# Patient Record
Sex: Male | Born: 1985 | Hispanic: No | Marital: Single | Smoking: Former smoker
Health system: Southern US, Community
[De-identification: ages and names within clinical notes are randomized; demographics above are authoritative.]

## PROBLEM LIST (undated history)

## (undated) DIAGNOSIS — K589 Irritable bowel syndrome without diarrhea: Secondary | ICD-10-CM

## (undated) HISTORY — DX: Irritable bowel syndrome, unspecified: K58.9

---

## 2012-12-19 ENCOUNTER — Encounter (HOSPITAL_COMMUNITY): Payer: Self-pay | Admitting: *Deleted

## 2012-12-19 ENCOUNTER — Emergency Department (HOSPITAL_COMMUNITY): Payer: PRIVATE HEALTH INSURANCE

## 2012-12-19 ENCOUNTER — Emergency Department (HOSPITAL_COMMUNITY)
Admission: EM | Admit: 2012-12-19 | Discharge: 2012-12-19 | Disposition: A | Discharge status: Home / Self Care | Payer: PRIVATE HEALTH INSURANCE | Attending: Emergency Medicine | Admitting: Emergency Medicine

## 2012-12-19 DIAGNOSIS — F17200 Nicotine dependence, unspecified, uncomplicated: Secondary | ICD-10-CM | POA: Insufficient documentation

## 2012-12-19 DIAGNOSIS — Y9339 Activity, other involving climbing, rappelling and jumping off: Secondary | ICD-10-CM | POA: Insufficient documentation

## 2012-12-19 DIAGNOSIS — S8990XA Unspecified injury of unspecified lower leg, initial encounter: Secondary | ICD-10-CM | POA: Insufficient documentation

## 2012-12-19 DIAGNOSIS — X500XXA Overexertion from strenuous movement or load, initial encounter: Secondary | ICD-10-CM | POA: Insufficient documentation

## 2012-12-19 DIAGNOSIS — M25572 Pain in left ankle and joints of left foot: Secondary | ICD-10-CM

## 2012-12-19 DIAGNOSIS — S99929A Unspecified injury of unspecified foot, initial encounter: Secondary | ICD-10-CM | POA: Insufficient documentation

## 2012-12-19 DIAGNOSIS — Y9289 Other specified places as the place of occurrence of the external cause: Secondary | ICD-10-CM | POA: Insufficient documentation

## 2012-12-19 MED ORDER — IBUPROFEN 800 MG PO TABS: 800.0000 mg | ORAL_TABLET | Freq: Three times a day (TID) | ORAL | Status: DC

## 2012-12-19 NOTE — ED Notes (Signed)
Pt reports jumped over barrier and twisted left ankle

## 2012-12-19 NOTE — ED Provider Notes (Signed)
History     CSN: [id number]  Arrival date & time 12/19/12  4098   None     Chief Complaint  Patient presents with  . Ankle Pain    (Consider location/radiation/quality/duration/timing/severity/associated sxs/prior treatment) Patient is a 27 y.o. male presenting with ankle pain. The history is provided by the patient. No language interpreter was used.  Ankle Pain Location:  Ankle Time since incident:  2 days Ankle location:  L ankle Pain details:    Quality:  Sharp   Radiates to: along lateral dorsal aspect of L foot.   Severity:  Mild   Onset quality:  Sudden   Timing:  Intermittent   Progression:  Unchanged Chronicity:  New Dislocation: no   Foreign body present:  No foreign bodies Relieved by:  Rest and elevation Worsened by:  Activity and bearing weight (palpation) Ineffective treatments:  None tried Associated symptoms: swelling   Associated symptoms: no decreased ROM, no fever, no muscle weakness, no numbness and no tingling     History reviewed. No pertinent past medical history.  History reviewed. No pertinent past surgical history.  No family history on file.  History  Substance Use Topics  . Smoking status: Current Every Day Smoker    Types: Cigarettes  . Smokeless tobacco: Never Used  . Alcohol Use: Yes     Comment: sometimes     Review of Systems  Constitutional: Negative for fever.  Musculoskeletal: Positive for joint swelling and arthralgias.  Skin: Negative for pallor.  Neurological: Negative for weakness and numbness.  All other systems reviewed and are negative.    Allergies  Review of patient's allergies indicates no known allergies.  Home Medications   Current Outpatient Rx  Name  Route  Sig  Dispense  Refill  . ibuprofen (ADVIL,MOTRIN) 800 MG tablet   Oral   Take 1 tablet (800 mg total) by mouth 3 (three) times daily.   21 tablet   0     BP 138/92  Pulse 75  Temp(Src) 98.3 F (36.8 C) (Oral)  Resp 18  Ht 5' 8.9"  (1.75 m)  Wt 158 lb 11.7 oz (72 kg)  BMI 23.51 kg/m2  SpO2 99%  Physical Exam  Nursing note and vitals reviewed. Constitutional: He is oriented to person, place, and time. He appears well-developed and well-nourished. No distress.  HENT:  Head: Normocephalic and atraumatic.  Eyes: Conjunctivae and EOM are normal. No scleral icterus.  Neck: Normal range of motion.  Cardiovascular: Normal rate, regular rhythm and intact distal pulses.   DP and PT pulses 2+ bilaterally; capillary refill normal  Pulmonary/Chest: Effort normal. No respiratory distress.  Musculoskeletal:       Left ankle: He exhibits decreased range of motion and swelling (mild). He exhibits no ecchymosis, no deformity and normal pulse. Tenderness. Lateral malleolus tenderness found. Achilles tendon normal.       Left lower leg: Normal.       Left foot: Normal.  Neurological: He is alert and oriented to person, place, and time.  Skin: Skin is warm and dry. No rash noted. He is not diaphoretic. No erythema. No pallor.  Psychiatric: He has a normal mood and affect. His behavior is normal.    ED Course  Procedures (including critical care time)  Labs Reviewed - No data to display Dg Ankle Complete Left  12/19/2012   *RADIOLOGY REPORT*  Clinical Data: Ankle pain.  Twisting injury.  LEFT ANKLE COMPLETE - 3+ VIEW  Comparison: None.  Findings: Mild lateral  soft tissue swelling.  No fracture, subluxation or dislocation.  Joint spaces are maintained.  IMPRESSION: Acute bony abnormality.   Original Report Authenticated By: Charlett Nose, M.D.    1. Ankle pain, left     MDM  Uncomplicated left ankle pain - patient is neurovascularly intact without pallor, poikilothermia, or paresthesias. X-ray of left ankle without evidence of fracture, subluxation, dislocation, or bony abnormality. Patient to be given ASO ankle brace and crutches for symptomatic improvement. Patient appropriate for discharge with primary care followup. Ibuprofen  recommended for symptoms and RICE instruction provided. Patient states comfort and understanding with this discharge plan with no unaddressed concerns.        Antony Madura, PA-C 12/23/12 1935

## 2012-12-24 NOTE — ED Provider Notes (Signed)
Medical screening examination/treatment/procedure(s) were performed by non-physician practitioner and as supervising physician I was immediately available for consultation/collaboration.  Maesyn Frisinger M Nahomy Limburg, MD 12/24/12 0716 

## 2013-05-18 ENCOUNTER — Emergency Department (HOSPITAL_COMMUNITY)
Admission: EM | Admit: 2013-05-18 | Discharge: 2013-05-18 | Disposition: A | Discharge status: Home / Self Care | Payer: Managed Care, Other (non HMO) | Attending: Emergency Medicine | Admitting: Emergency Medicine

## 2013-05-18 ENCOUNTER — Encounter (HOSPITAL_COMMUNITY): Payer: Self-pay | Admitting: Emergency Medicine

## 2013-05-18 DIAGNOSIS — Y9389 Activity, other specified: Secondary | ICD-10-CM | POA: Insufficient documentation

## 2013-05-18 DIAGNOSIS — X58XXXA Exposure to other specified factors, initial encounter: Secondary | ICD-10-CM | POA: Insufficient documentation

## 2013-05-18 DIAGNOSIS — S39012A Strain of muscle, fascia and tendon of lower back, initial encounter: Secondary | ICD-10-CM

## 2013-05-18 DIAGNOSIS — S336XXA Sprain of sacroiliac joint, initial encounter: Secondary | ICD-10-CM | POA: Insufficient documentation

## 2013-05-18 DIAGNOSIS — Y929 Unspecified place or not applicable: Secondary | ICD-10-CM | POA: Insufficient documentation

## 2013-05-18 DIAGNOSIS — F17200 Nicotine dependence, unspecified, uncomplicated: Secondary | ICD-10-CM | POA: Insufficient documentation

## 2013-05-18 MED ORDER — IBUPROFEN 600 MG PO TABS: 600.0000 mg | ORAL_TABLET | Freq: Four times a day (QID) | ORAL | Status: DC | PRN

## 2013-05-18 MED ORDER — METHOCARBAMOL 500 MG PO TABS: 500.0000 mg | ORAL_TABLET | Freq: Two times a day (BID) | ORAL | Status: DC

## 2013-05-18 NOTE — ED Provider Notes (Signed)
CSN: [id number]     Arrival date & time 05/18/13  8295 History   First MD Initiated Contact with Patient 05/18/13 0612     Chief Complaint  Patient presents with  . Back Pain   (Consider location/radiation/quality/duration/timing/severity/associated sxs/prior Treatment) Patient is a 27 y.o. male presenting with back pain. The history is provided by the patient. No language interpreter was used.  Back Pain Location:  Lumbar spine Quality: pressure. Radiates to:  Does not radiate Pain severity:  Moderate Onset quality:  Gradual Duration:  6 days Timing:  Intermittent Progression:  Worsening (x2 days) Chronicity:  New Context comment:  Onset after sleeping on the couch Relieved by:  None tried Worsened by:  Palpation and movement Ineffective treatments:  None tried Associated symptoms: no bladder incontinence, no bowel incontinence, no chest pain, no dysuria, no fever, no numbness, no paresthesias, no perianal numbness, no tingling and no weakness   Risk factors: no hx of cancer     History reviewed. No pertinent past medical history. History reviewed. No pertinent past surgical history. History reviewed. No pertinent family history. History  Substance Use Topics  . Smoking status: Current Every Day Smoker    Types: Cigarettes  . Smokeless tobacco: Never Used  . Alcohol Use: Yes     Comment: sometimes    Review of Systems  Constitutional: Negative for fever.  Cardiovascular: Negative for chest pain.  Gastrointestinal: Negative for bowel incontinence.  Genitourinary: Negative for bladder incontinence and dysuria.  Musculoskeletal: Positive for back pain.  Neurological: Negative for tingling, weakness, numbness and paresthesias.  All other systems reviewed and are negative.    Allergies  Review of patient's allergies indicates no known allergies.  Home Medications   Current Outpatient Rx  Name  Route  Sig  Dispense  Refill  . ibuprofen (ADVIL,MOTRIN) 600 MG  tablet   Oral   Take 1 tablet (600 mg total) by mouth every 6 (six) hours as needed for pain.   30 tablet   0   . methocarbamol (ROBAXIN) 500 MG tablet   Oral   Take 1 tablet (500 mg total) by mouth 2 (two) times daily.   20 tablet   0    BP 128/74  Pulse 69  Temp(Src) 98.3 F (36.8 C) (Oral)  Resp 18  SpO2 100% Physical Exam  Nursing note and vitals reviewed. Constitutional: He is oriented to person, place, and time. He appears well-developed and well-nourished. No distress.  HENT:  Head: Normocephalic and atraumatic.  Eyes: Conjunctivae and EOM are normal. No scleral icterus.  Neck: Normal range of motion.  Cardiovascular: Normal rate, regular rhythm and intact distal pulses.   DP and PT pulses 2+ b/l  Pulmonary/Chest: Effort normal. No respiratory distress.  Musculoskeletal: Normal range of motion.       Lumbar back: He exhibits tenderness and spasm. He exhibits normal range of motion, no bony tenderness, no swelling, no deformity, no laceration and no pain.       Back:  Tenderness to palpation of the left lumbosacral paraspinal muscles with spasm. No midline tenderness to palpation of the thoracic or lumbosacral spine. No bony deformities or step-offs palpated.  Neurological: He is alert and oriented to person, place, and time.  No sensory or motor deficits appreciated. DTRs normal and symmetric. Patient ambulatory with normal gait.  Skin: Skin is warm and dry. No rash noted. He is not diaphoretic. No erythema. No pallor.  Psychiatric: He has a normal mood and affect. His behavior is  normal.    ED Course  Procedures (including critical care time) Labs Review Labs Reviewed - No data to display Imaging Review No results found.  EKG Interpretation   None       MDM   1. Low back strain, initial encounter    27 year old male with no significant past medical history presents for strain to his left lower back x6 days, worsening x2 days. Patient states onset was  after sleeping on a couch overnight. Patient is neurovascularly intact and ambulatory. No midline tenderness to the lumbosacral spine appreciated. No red flags or signs concerning for cauda equina. Do not believe further work up with imaging is indicated at this time. Patient appropriate for discharge with ibuprofen and Robaxin for symptoms as well as instructions to apply ice to the affected area at least 4 times a day. Return precautions discussed and patient agreeable to plan with no unaddressed concerns.    Antony Madura, [place] 05/18/13 [phone]

## 2013-05-18 NOTE — ED Notes (Signed)
Patient is alert and oriented x3.  He was given DC instructions and follow up visit instructions.  Patient gave verbal understanding.  He was DC ambulatory under his own power to home.  V/S stable.  He was not showing any signs of distress on DC 

## 2013-05-18 NOTE — ED Provider Notes (Signed)
Medical screening examination/treatment/procedure(s) were performed by non-physician practitioner and as supervising physician I was immediately available for consultation/collaboration.  Chantelle Verdi M Raelynne Ludwick, MD 05/18/13 0701 

## 2013-05-18 NOTE — ED Notes (Signed)
Pt presents to the ED with a complaint of back pain.  Pt has had back pain for 6 days.  Pt is unsure if it was from sleeping on his sofa or driving 10 hours in one day.  Pt states pain is in his lower back

## 2013-05-18 NOTE — ED Notes (Addendum)
Patient is alert and oriented x3.  He is complaining of lower back pain that started 6 days ago.  He states That the pain is in his lower left back with radiation to the center lower back.  He denies any problems with urination or bowel movements. He also denies any numbness and tingling in any extremity.

## 2013-05-23 ENCOUNTER — Emergency Department (HOSPITAL_COMMUNITY)
Admission: EM | Admit: 2013-05-23 | Discharge: 2013-05-23 | Disposition: A | Discharge status: Home / Self Care | Payer: Managed Care, Other (non HMO) | Attending: Emergency Medicine | Admitting: Emergency Medicine

## 2013-05-23 ENCOUNTER — Encounter (HOSPITAL_COMMUNITY): Payer: Self-pay | Admitting: Emergency Medicine

## 2013-05-23 ENCOUNTER — Encounter: Payer: Self-pay | Admitting: Gastroenterology

## 2013-05-23 DIAGNOSIS — R509 Fever, unspecified: Secondary | ICD-10-CM | POA: Insufficient documentation

## 2013-05-23 DIAGNOSIS — R112 Nausea with vomiting, unspecified: Secondary | ICD-10-CM | POA: Insufficient documentation

## 2013-05-23 DIAGNOSIS — F17200 Nicotine dependence, unspecified, uncomplicated: Secondary | ICD-10-CM | POA: Insufficient documentation

## 2013-05-23 LAB — POCT I-STAT, CHEM 8
BUN: 20 mg/dL (ref 6–23)
Calcium, Ion: 1.17 mmol/L (ref 1.12–1.23)
Chloride: 106 meq/L (ref 96–112)
Creatinine, Ser: 1 mg/dL (ref 0.50–1.35)
Glucose, Bld: 113 mg/dL — ABNORMAL HIGH (ref 70–99)
HCT: 44 % (ref 39.0–52.0)
Hemoglobin: 15 g/dL (ref 13.0–17.0)
Potassium: 4.6 meq/L (ref 3.5–5.1)
Sodium: 142 meq/L (ref 135–145)
TCO2: 25 mmol/L (ref 0–100)

## 2013-05-23 MED ORDER — ONDANSETRON 4 MG PO TBDP: 4.0000 mg | ORAL_TABLET | Freq: Three times a day (TID) | ORAL | Status: DC | PRN

## 2013-05-23 MED ORDER — ONDANSETRON 4 MG PO TBDP
4.0000 mg | ORAL_TABLET | Freq: Once | ORAL | Status: AC
  Administered 2013-05-23: 4 mg via ORAL
  Filled 2013-05-23: qty 1

## 2013-05-23 MED ORDER — IBUPROFEN 800 MG PO TABS
800.0000 mg | ORAL_TABLET | Freq: Once | ORAL | Status: AC
  Administered 2013-05-23: 800 mg via ORAL
  Filled 2013-05-23: qty 1

## 2013-05-23 NOTE — ED Provider Notes (Signed)
Medical screening examination/treatment/procedure(s) were performed by non-physician practitioner and as supervising physician I was immediately available for consultation/collaboration.   Kristen N Ward, DO 05/23/13 0436 

## 2013-05-23 NOTE — ED Notes (Signed)
Pt reports vomiting and chills x1 day. Denies diarrhea or abd pain

## 2013-05-23 NOTE — ED Provider Notes (Signed)
CSN: [id number]     Arrival date & time 05/23/13  0100 History   First MD Initiated Contact with Patient 05/23/13 0106     Chief Complaint  Patient presents with  . Emesis   (Consider location/radiation/quality/duration/timing/severity/associated sxs/prior Treatment) HPI  Pt language: English and Arabic  Patient without any past medical history presents to the ED bib friends for 24 hours of nausea, vomiting, chills and subjective fever. He says that he has taken two doses of a new pain medication but has not had any in a few days. Otherwise he denies eating or drinking anything that his roommates/friends have not also had. He denies having any pain to his abdomen, dysuria, constipation, headache or neck pain. In triage he does not have any fever and has not taken any medication in the past 24 hours. He is able to keep some fluids down but has not been keeping solid food down. No blood or bile in his vomit. He has had some mild decrease of energy.   History reviewed. No pertinent past medical history. History reviewed. No pertinent past surgical history. No family history on file. History  Substance Use Topics  . Smoking status: Current Every Day Smoker    Types: Cigarettes  . Smokeless tobacco: Never Used  . Alcohol Use: Yes     Comment: sometimes    Review of Systems  The patient denies anorexia, fever, weight loss,, vision loss, decreased hearing, hoarseness, chest pain, syncope, dyspnea on exertion, peripheral edema, balance deficits, hemoptysis, abdominal pain, melena, hematochezia, severe indigestion/heartburn, hematuria, incontinence, genital sores, muscle weakness, suspicious skin lesions, transient blindness, difficulty walking, depression, unusual weight change, abnormal bleeding, enlarged lymph nodes, angioedema, and breast masses.    Allergies  Review of patient's allergies indicates no known allergies.  Home Medications   Current Outpatient Rx  Name  Route  Sig   Dispense  Refill  . ondansetron (ZOFRAN-ODT) 4 MG disintegrating tablet   Oral   Take 1 tablet (4 mg total) by mouth every 8 (eight) hours as needed for nausea.   20 tablet   0    BP 133/78  Pulse 92  Temp(Src) 98.9 F (37.2 C) (Oral)  Resp 18  SpO2 98% Physical Exam  Nursing note and vitals reviewed. Constitutional: He appears well-developed and well-nourished. No distress.  HENT:  Head: Normocephalic and atraumatic.  Right Ear: Tympanic membrane and ear canal normal.  Left Ear: Tympanic membrane and ear canal normal.  Nose: No rhinorrhea or sinus tenderness.  Mouth/Throat: Uvula is midline and oropharynx is clear and moist.  Eyes: Pupils are equal, round, and reactive to light.  Neck: Normal range of motion. Neck supple.  Cardiovascular: Normal rate and regular rhythm.   Pulmonary/Chest: Effort normal and breath sounds normal. He has no decreased breath sounds. He has no wheezes.  Abdominal: Soft. Bowel sounds are normal. There is no tenderness.  Neurological: He is alert.  Skin: Skin is warm and dry.    ED Course  Procedures (including critical care time) Labs Review Labs Reviewed  POCT I-STAT, CHEM 8 - Abnormal; Notable for the following:    Glucose, Bld 113 (*)    All other components within normal limits   Imaging Review No results found.  EKG Interpretation   None       MDM   1. Nausea and vomiting in adult      Normal physical exam. I-stat chem 8 shows no acute or concerning abnormalities regarding pt vomting. He was given a  4mg  ODT zofran and Ibuprofen then fluid challenged with Orange Juice and water. Pt tolerated both well without any episode of vomiting.   Pt feeling better and wuold like to go home.  Rx: Zofran ODT Referral: Ortho  27 y.o.Jetson Wagley's evaluation in the Emergency Department is complete. It has been determined that no acute conditions requiring further emergency intervention are present at this time. The patient/guardian  have been advised of the diagnosis and plan. We have discussed signs and symptoms that warrant return to the ED, such as changes or worsening in symptoms.  Vital signs are stable at discharge. Filed Vitals:   05/23/13 0102  BP: 133/78  Pulse: 92  Temp: 98.9 F (37.2 C)  Resp: 18    Patient/guardian has voiced understanding and agreed to follow-up with the PCP or specialist.   Dorthula Matas, PA-C 05/23/13 [phone]

## 2013-05-23 NOTE — ED Notes (Signed)
Pt given orange juice and water for fluid challenge per request.

## 2013-06-26 ENCOUNTER — Ambulatory Visit: Payer: Managed Care, Other (non HMO) | Admitting: Gastroenterology

## 2013-07-02 ENCOUNTER — Ambulatory Visit (INDEPENDENT_AMBULATORY_CARE_PROVIDER_SITE_OTHER): Payer: Managed Care, Other (non HMO) | Admitting: Family Medicine

## 2013-07-02 DIAGNOSIS — K219 Gastro-esophageal reflux disease without esophagitis: Secondary | ICD-10-CM

## 2013-07-02 DIAGNOSIS — J309 Allergic rhinitis, unspecified: Secondary | ICD-10-CM

## 2013-07-02 DIAGNOSIS — H6590 Unspecified nonsuppurative otitis media, unspecified ear: Secondary | ICD-10-CM

## 2013-07-02 MED ORDER — FLUTICASONE PROPIONATE 50 MCG/ACT NA SUSP: 2.0000 | Freq: Every day | NASAL | Status: DC

## 2013-07-02 MED ORDER — RANITIDINE HCL 150 MG PO TABS: 150.0000 mg | ORAL_TABLET | Freq: Two times a day (BID) | ORAL | Status: DC

## 2013-07-02 NOTE — Progress Notes (Addendum)
Subjective:    Patient ID: Logan Glover, male    DOB: [birth year], 27 y.o.   MRN: [id number]  This chart was scribed for Meredith Staggers, MD by Greggory Stallion, Medical Scribe. This patient's care was started at 4:19 PM.  HPI Pt's friend is translating from Arabic for him.  HPI Comments: Logan Glover is a 27 y.o. male who presents to the office complaining of heartburn symptoms for one month after eating. Pt has taken an OTC medication from [place] for 2 weeks with temporary relief of it. He is also having congestion, sore throat, post nasal drip and right ear pain that started 2 weeks ago. Pt is hearing a clicking in his right ear but denies ringing. Denies hematochezia, black tarry stool, hearing loss, fever, abnormal weight loss, night sweats, sneezing, rhinorrhea. Denies alcohol or cigarette use. Pt has one cup of coffee per day.   There are no active problems to display for this patient.  History reviewed. No pertinent past medical history. History reviewed. No pertinent past surgical history. No Known Allergies Prior to Admission medications   Medication Sig Start Date End Date Taking? Authorizing Provider  ondansetron (ZOFRAN-ODT) 4 MG disintegrating tablet Take 1 tablet (4 mg total) by mouth every 8 (eight) hours as needed for nausea. 05/23/13   Dorthula Matas, PA-C   History   Social History  . Marital Status: Single    Spouse Name: N/A    Number of Children: N/A  . Years of Education: N/A   Occupational History  . Not on file.   Social History Main Topics  . Smoking status: Current Every Day Smoker    Types: Cigarettes  . Smokeless tobacco: Never Used  . Alcohol Use: Yes     Comment: sometimes  . Drug Use: No  . Sexual Activity: Not on file   Other Topics Concern  . Not on file   Social History Narrative  . No narrative on file   Review of Systems  Constitutional: Negative for fever, diaphoresis and unexpected weight change.  HENT: Positive for congestion,  ear pain, postnasal drip and tinnitus. Negative for hearing loss, rhinorrhea and sneezing.   Gastrointestinal: Negative for blood in stool.       Negative black, tarry stool.      Objective:   Physical Exam  Vitals reviewed. Constitutional: He is oriented to person, place, and time. He appears well-developed and well-nourished.  HENT:  Head: Normocephalic and atraumatic.  Right Ear: Tympanic membrane, external ear and ear canal normal. No drainage. Tympanic membrane is not erythematous.  Left Ear: Tympanic membrane, external ear and ear canal normal.  Nose: No rhinorrhea. Right sinus exhibits no maxillary sinus tenderness and no frontal sinus tenderness. Left sinus exhibits no maxillary sinus tenderness and no frontal sinus tenderness.  Mouth/Throat: Mucous membranes are normal. Posterior oropharyngeal erythema present. No oropharyngeal exudate.  Left TM is pearly gray with minimal clear fluid at base of TM. Right ear has slight increased clear fluid at base of TM. Mildly edematous turbinates of the nose.   Eyes: Conjunctivae are normal. Pupils are equal, round, and reactive to light.  Neck: Neck supple.  No tenderness along the mastoid process bilaterally.   Cardiovascular: Normal rate, regular rhythm, normal heart sounds and intact distal pulses.  Exam reveals no gallop and no friction rub.   No murmur heard. Pulmonary/Chest: Effort normal and breath sounds normal. He has no wheezes. He has no rhonchi. He has no rales.  Abdominal: Soft. There  is no tenderness.  Lymphadenopathy:    He has no cervical adenopathy.  Neurological: He is alert and oriented to person, place, and time.  Skin: Skin is warm and dry. No rash noted.  Psychiatric: He has a normal mood and affect. His behavior is normal.    Filed Vitals:   07/02/13 1448  BP: 122/78  Pulse: 92  Temp: 97.8 F (36.6 C)  TempSrc: Oral  Resp: 16  Height: 5' 6.75" (1.695 m)  Weight: 153 lb (69.4 kg)  SpO2: 99%   No exam data  present     Assessment & Plan:   Logan Glover is a 27 y.o. male Serous otitis media, right - Plan: fluticasone (FLONASE) 50 MCG/ACT nasal spray, Allergic rhinitis - Plan: fluticasone (FLONASE) 50 MCG/ACT nasal spray  - suspected AR contributor. Trail of flonase QHS, saline ns. rtc precautions if not improving.    GERD (gastroesophageal reflux disease) - Plan: ranitidine (ZANTAC) 150 MG tablet, trigger avoidance as below, 2 weeks trial of H2 blocker, rtc precautions.   Language barrier - friend translating, understanding expressed.   Meds ordered this encounter  Medications  . fluticasone (FLONASE) 50 MCG/ACT nasal spray    Sig: Place 2 sprays into both nostrils daily. At bedtime.    Dispense:  16 g    Refill:  2  . ranitidine (ZANTAC) 150 MG tablet    Sig: Take 1 tablet (150 mg total) by mouth 2 (two) times daily.    Dispense:  30 tablet    Refill:  0   Patient Instructions  Start the Zantac twice per day, avoid the foods that can cause heartburn as below, recheck in next 2 weeks. Sooner if worse.   Your ear and nasal congestion are likely due to allergies - can try FLonase nasal spray as prescribed, Saline nasal spray atleast 4 times per day if needed for congestion,and if needed - sudafed occasionally for pressure or congestion in the ear - use this sparingly.  recheck in next 2 weeks if not improving. Return to the clinic or go to the nearest emergency room if any of your symptoms worsen or new symptoms occur.   Serous Otitis Media  Serous otitis media is fluid in the middle ear space. This space contains the bones for hearing and air. Air in the middle ear space helps to transmit sound.  The air gets there through the eustachian tube. This tube goes from the back of the nose (nasopharynx) to the middle ear space. It keeps the pressure in the middle ear the same as the outside world. It also helps to drain fluid from the middle ear space. CAUSES  Serous otitis media occurs  when the eustachian tube gets blocked. Blockage can come from:  Ear infections.  Colds and other upper respiratory infections.  Allergies.  Irritants such as cigarette smoke.  Sudden changes in air pressure (such as descending in an airplane).  Enlarged adenoids.  A mass in the nasopharynx. During colds and upper respiratory infections, the middle ear space can become temporarily filled with fluid. This can happen after an ear infection also. Once the infection clears, the fluid will generally drain out of the ear through the eustachian tube. If it does not, then serous otitis media occurs. SIGNS AND SYMPTOMS   Hearing loss.  A feeling of fullness in the ear, without pain.  Young children may not show any symptoms but may show slight behavioral changes, such as agitation, ear pulling, or crying. DIAGNOSIS  Serous otitis media is diagnosed by an ear exam. Tests may be done to check on the movement of the eardrum. Hearing exams may also be done. TREATMENT  The fluid most often goes away without treatment. If allergy is the cause, allergy treatment may be helpful. Fluid that persists for several months may require minor surgery. A small tube is placed in the eardrum to:  Drain the fluid.  Restore the air in the middle ear space. In certain situations, antibiotics are used to avoid surgery. Surgery may be done to remove enlarged adenoids (if this is the cause). HOME CARE INSTRUCTIONS   Keep children away from tobacco smoke.  Be sure to keep any follow-up appointments. SEEK MEDICAL CARE IF:   Your hearing is not better in 3 months.  Your hearing is worse.  You have ear pain.  You have drainage from the ear.  You have dizziness.  You have serous otitis media only in one ear or have any bleeding from your nose (epistaxis).  You notice a lump on your neck. MAKE SURE YOU:  Understand these instructions.   Will watch your condition.   Will get help right away if you  are not doing well or get worse.  Document Released: 10/14/2003 Document Revised: 03/26/2013 Document Reviewed: 02/18/2013 [facility] Patient Information 2014 [place], [place].   Diet for Gastroesophageal Reflux Disease, Adult Reflux (acid reflux) is when acid from your stomach flows up into the esophagus. When acid comes in contact with the esophagus, the acid causes irritation and soreness (inflammation) in the esophagus. When reflux happens often or so severely that it causes damage to the esophagus, it is called gastroesophageal reflux disease (GERD). Nutrition therapy can help ease the discomfort of GERD. FOODS OR DRINKS TO AVOID OR LIMIT  Smoking or chewing tobacco. Nicotine is one of the most potent stimulants to acid production in the gastrointestinal tract.  Caffeinated and decaffeinated coffee and black tea.  Regular or low-calorie carbonated beverages or energy drinks (caffeine-free carbonated beverages are allowed).   Strong spices, such as black pepper, white pepper, red pepper, cayenne, curry powder, and chili powder.  Peppermint or spearmint.  Chocolate.  High-fat foods, including meats and fried foods. Extra added fats including oils, butter, salad dressings, and nuts. Limit these to less than 8 tsp per day.  Fruits and vegetables if they are not tolerated, such as citrus fruits or tomatoes.  Alcohol.  Any food that seems to aggravate your condition. If you have questions regarding your diet, call your caregiver or a registered dietitian. OTHER THINGS THAT MAY HELP GERD INCLUDE:   Eating your meals slowly, in a relaxed setting.  Eating 5 to 6 small meals per day instead of 3 large meals.  Eliminating food for a period of time if it causes distress.  Not lying down until 3 hours after eating a meal.  Keeping the head of your bed raised 6 to 9 inches (15 to 23 cm) by using a foam wedge or blocks under the legs of the bed. Lying flat may make symptoms  worse.  Being physically active. Weight loss may be helpful in reducing reflux in overweight or obese adults.  Wear loose fitting clothing EXAMPLE MEAL PLAN This meal plan is approximately 2,000 calories based on https://www.bernard.org/ meal planning guidelines. Breakfast   cup cooked oatmeal.  1 cup strawberries.  1 cup low-fat milk.  1 oz almonds. Snack  1 cup cucumber slices.  6 oz yogurt (made from low-fat or fat-free milk).  Lunch  2 slice whole-wheat bread.  2 oz sliced [place].  2 tsp mayonnaise.  1 cup blueberries.  1 cup snap peas. Snack  6 whole-wheat crackers.  1 oz string cheese. Dinner   cup brown rice.  1 cup mixed veggies.  1 tsp olive oil.  3 oz grilled fish. Document Released: 07/24/2005 Document Revised: 10/16/2011 Document Reviewed: 06/09/2011 [facility] Patient Information 2014 [place], [place].      I personally performed the services described in this documentation, which was scribed in my presence. The recorded information has been reviewed and considered, and addended by me as needed.

## 2013-07-02 NOTE — Patient Instructions (Addendum)
Start the Zantac twice per day, avoid the foods that can cause heartburn as below, recheck in next 2 weeks. Sooner if worse.   Your ear and nasal congestion are likely due to allergies - can try FLonase nasal spray as prescribed, Saline nasal spray atleast 4 times per day if needed for congestion,and if needed - sudafed occasionally for pressure or congestion in the ear - use this sparingly.  recheck in next 2 weeks if not improving. Return to the clinic or go to the nearest emergency room if any of your symptoms worsen or new symptoms occur.   Serous Otitis Media  Serous otitis media is fluid in the middle ear space. This space contains the bones for hearing and air. Air in the middle ear space helps to transmit sound.  The air gets there through the eustachian tube. This tube goes from the back of the nose (nasopharynx) to the middle ear space. It keeps the pressure in the middle ear the same as the outside world. It also helps to drain fluid from the middle ear space. CAUSES  Serous otitis media occurs when the eustachian tube gets blocked. Blockage can come from:  Ear infections.  Colds and other upper respiratory infections.  Allergies.  Irritants such as cigarette smoke.  Sudden changes in air pressure (such as descending in an airplane).  Enlarged adenoids.  A mass in the nasopharynx. During colds and upper respiratory infections, the middle ear space can become temporarily filled with fluid. This can happen after an ear infection also. Once the infection clears, the fluid will generally drain out of the ear through the eustachian tube. If it does not, then serous otitis media occurs. SIGNS AND SYMPTOMS   Hearing loss.  A feeling of fullness in the ear, without pain.  Young children may not show any symptoms but may show slight behavioral changes, such as agitation, ear pulling, or crying. DIAGNOSIS  Serous otitis media is diagnosed by an ear exam. Tests may be done to check  on the movement of the eardrum. Hearing exams may also be done. TREATMENT  The fluid most often goes away without treatment. If allergy is the cause, allergy treatment may be helpful. Fluid that persists for several months may require minor surgery. A small tube is placed in the eardrum to:  Drain the fluid.  Restore the air in the middle ear space. In certain situations, antibiotics are used to avoid surgery. Surgery may be done to remove enlarged adenoids (if this is the cause). HOME CARE INSTRUCTIONS   Keep children away from tobacco smoke.  Be sure to keep any follow-up appointments. SEEK MEDICAL CARE IF:   Your hearing is not better in 3 months.  Your hearing is worse.  You have ear pain.  You have drainage from the ear.  You have dizziness.  You have serous otitis media only in one ear or have any bleeding from your nose (epistaxis).  You notice a lump on your neck. MAKE SURE YOU:  Understand these instructions.   Will watch your condition.   Will get help right away if you are not doing well or get worse.  Document Released: 10/14/2003 Document Revised: 03/26/2013 Document Reviewed: 02/18/2013 [facility] Patient Information 2014 [place], [place].   Diet for Gastroesophageal Reflux Disease, Adult Reflux (acid reflux) is when acid from your stomach flows up into the esophagus. When acid comes in contact with the esophagus, the acid causes irritation and soreness (inflammation) in the esophagus. When reflux happens  often or so severely that it causes damage to the esophagus, it is called gastroesophageal reflux disease (GERD). Nutrition therapy can help ease the discomfort of GERD. FOODS OR DRINKS TO AVOID OR LIMIT  Smoking or chewing tobacco. Nicotine is one of the most potent stimulants to acid production in the gastrointestinal tract.  Caffeinated and decaffeinated coffee and black tea.  Regular or low-calorie carbonated beverages or energy drinks (caffeine-free  carbonated beverages are allowed).   Strong spices, such as black pepper, white pepper, red pepper, cayenne, curry powder, and chili powder.  Peppermint or spearmint.  Chocolate.  High-fat foods, including meats and fried foods. Extra added fats including oils, butter, salad dressings, and nuts. Limit these to less than 8 tsp per day.  Fruits and vegetables if they are not tolerated, such as citrus fruits or tomatoes.  Alcohol.  Any food that seems to aggravate your condition. If you have questions regarding your diet, call your caregiver or a registered dietitian. OTHER THINGS THAT MAY HELP GERD INCLUDE:   Eating your meals slowly, in a relaxed setting.  Eating 5 to 6 small meals per day instead of 3 large meals.  Eliminating food for a period of time if it causes distress.  Not lying down until 3 hours after eating a meal.  Keeping the head of your bed raised 6 to 9 inches (15 to 23 cm) by using a foam wedge or blocks under the legs of the bed. Lying flat may make symptoms worse.  Being physically active. Weight loss may be helpful in reducing reflux in overweight or obese adults.  Wear loose fitting clothing EXAMPLE MEAL PLAN This meal plan is approximately 2,000 calories based on https://www.bernard.org/ meal planning guidelines. Breakfast   cup cooked oatmeal.  1 cup strawberries.  1 cup low-fat milk.  1 oz almonds. Snack  1 cup cucumber slices.  6 oz yogurt (made from low-fat or fat-free milk). Lunch  2 slice whole-wheat bread.  2 oz sliced [place].  2 tsp mayonnaise.  1 cup blueberries.  1 cup snap peas. Snack  6 whole-wheat crackers.  1 oz string cheese. Dinner   cup brown rice.  1 cup mixed veggies.  1 tsp olive oil.  3 oz grilled fish. Document Released: 07/24/2005 Document Revised: 10/16/2011 Document Reviewed: 06/09/2011 [facility] Patient Information 2014 [place], [place].

## 2013-07-07 ENCOUNTER — Ambulatory Visit (INDEPENDENT_AMBULATORY_CARE_PROVIDER_SITE_OTHER): Payer: Managed Care, Other (non HMO) | Admitting: Physician Assistant

## 2013-07-07 VITALS — BP 100/60 | HR 85 | Temp 98.4°F | Resp 16 | Ht 68.5 in | Wt 163.0 lb

## 2013-07-07 DIAGNOSIS — R05 Cough: Secondary | ICD-10-CM

## 2013-07-07 DIAGNOSIS — R0981 Nasal congestion: Secondary | ICD-10-CM

## 2013-07-07 DIAGNOSIS — R059 Cough, unspecified: Secondary | ICD-10-CM

## 2013-07-07 DIAGNOSIS — J3489 Other specified disorders of nose and nasal sinuses: Secondary | ICD-10-CM

## 2013-07-07 DIAGNOSIS — R509 Fever, unspecified: Secondary | ICD-10-CM

## 2013-07-07 LAB — POCT INFLUENZA A/B
Influenza A, POC: NEGATIVE
Influenza B, POC: NEGATIVE

## 2013-07-07 LAB — POCT CBC
Granulocyte percent: 59.8 %G (ref 37–80)
HCT, POC: 47.9 % (ref 43.5–53.7)
Hemoglobin: 15 g/dL (ref 14.1–18.1)
Lymph, poc: 2.5 (ref 0.6–3.4)
MCH, POC: 27.6 pg (ref 27–31.2)
MCHC: 31.3 g/dL — AB (ref 31.8–35.4)
MCV: 88 fL (ref 80–97)
MID (cbc): 0.5 (ref 0–0.9)
MPV: 8.2 fL (ref 0–99.8)
POC Granulocyte: 4.5 (ref 2–6.9)
POC LYMPH PERCENT: 33.8 %L (ref 10–50)
POC MID %: 6.4 %M (ref 0–12)
Platelet Count, POC: 321 10*3/uL (ref 142–424)
RBC: 5.44 M/uL (ref 4.69–6.13)
RDW, POC: 14.2 %
WBC: 7.5 10*3/uL (ref 4.6–10.2)

## 2013-07-07 MED ORDER — IPRATROPIUM BROMIDE 0.03 % NA SOLN: 2.0000 | Freq: Two times a day (BID) | NASAL | Status: DC

## 2013-07-07 MED ORDER — HYDROCODONE-HOMATROPINE 5-1.5 MG/5ML PO SYRP: 5.0000 mL | ORAL_SOLUTION | Freq: Three times a day (TID) | ORAL | Status: DC | PRN

## 2013-07-07 MED ORDER — MUCINEX DM 30-600 MG PO TB12: 1.0000 | ORAL_TABLET | Freq: Two times a day (BID) | ORAL | Status: DC

## 2013-07-07 NOTE — Progress Notes (Signed)
Subjective:    Patient ID: Logan Glover, male    DOB: Aug 05, 1986, 27 y.o.   MRN: [id number]  Fever  Associated symptoms include congestion, coughing and headaches. Pertinent negatives include no ear pain, sore throat or wheezing.  Cough Associated symptoms include chills, a fever and headaches. Pertinent negatives include no ear pain, sore throat, shortness of breath or wheezing.   27 year old male presents for evaluation of cough, nasal congestion, sinus pressure, fever, chills, and body aches x 2 days. Symptoms started yesterday and have progressively worsened.  Denies nausea, vomiting, headache, otalgia, dizziness, sore throat, hemoptysis, wheezing or SOB.  Admits he feels fatigued and run down.  He has taken an OTC cough medicine that did help.  No known flu contacts.  He is a Consulting civil engineer at [employer].   Patient is otherwise healthy with no other concerns today.    Review of Systems  Constitutional: Positive for fever, chills and fatigue.  HENT: Positive for congestion and sinus pressure. Negative for ear pain and sore throat.   Respiratory: Positive for cough. Negative for chest tightness, shortness of breath and wheezing.   Neurological: Positive for headaches. Negative for dizziness.       Objective:   Physical Exam  Constitutional: He is oriented to person, place, and time. He appears well-developed and well-nourished.  HENT:  Head: Normocephalic and atraumatic.  Right Ear: Hearing, tympanic membrane, external ear and ear canal normal.  Left Ear: Hearing, tympanic membrane, external ear and ear canal normal.  Mouth/Throat: Uvula is midline, oropharynx is clear and moist and mucous membranes are normal.  Eyes: Conjunctivae are normal.  Neck: Normal range of motion. Neck supple.  Cardiovascular: Normal rate, regular rhythm and normal heart sounds.   Pulmonary/Chest: Effort normal and breath sounds normal.  Lymphadenopathy:    He has no cervical adenopathy.  Neurological:  He is alert and oriented to person, place, and time.  Psychiatric: He has a normal mood and affect. His behavior is normal. Judgment and thought content normal.    Results for orders placed in visit on 07/07/13  POCT INFLUENZA A/B      Result Value Range   Influenza A, POC Negative     Influenza B, POC Negative    POCT CBC      Result Value Range   WBC 7.5  4.6 - 10.2 K/uL   Lymph, poc 2.5  0.6 - 3.4   POC LYMPH PERCENT 33.8  10 - 50 %L   MID (cbc) 0.5  0 - 0.9   POC MID % 6.4  0 - 12 %M   POC Granulocyte 4.5  2 - 6.9   Granulocyte percent 59.8  37 - 80 %G   RBC 5.44  4.69 - 6.13 M/uL   Hemoglobin 15.0  14.1 - 18.1 g/dL   HCT, POC 78.2  95.6 - 53.7 %   MCV 88.0  80 - 97 fL   MCH, POC 27.6  27 - 31.2 pg   MCHC 31.3 (*) 31.8 - 35.4 g/dL   RDW, POC 21.3     Platelet Count, POC 321  142 - 424 K/uL   MPV 8.2  0 - 99.8 fL         Assessment & Plan:  Cough - Plan: POCT Influenza A/B  Fever, unspecified - Plan: POCT Influenza A/B  Likely viral illness.  Increase fluids and rest. Out of class today and tomorrow Hycodan cough syrup q8hours prn cough - caution sedation Mucinex  DM twice daily to help with congestion Tylenol or Motrin as directed for fever and chills If no improvement in 48-72 hours, ok to rx 7 day course of amoxicillin.  Follow up if symptoms worsen or fail to improve.

## 2013-07-14 ENCOUNTER — Other Ambulatory Visit: Payer: Self-pay | Admitting: Family Medicine

## 2013-08-17 ENCOUNTER — Ambulatory Visit (INDEPENDENT_AMBULATORY_CARE_PROVIDER_SITE_OTHER): Payer: Managed Care, Other (non HMO) | Admitting: Emergency Medicine

## 2013-08-17 VITALS — BP 112/60 | HR 80 | Temp 99.0°F | Resp 16 | Ht 68.0 in | Wt 159.0 lb

## 2013-08-17 DIAGNOSIS — H8110 Benign paroxysmal vertigo, unspecified ear: Secondary | ICD-10-CM

## 2013-08-17 MED ORDER — NAPROXEN SODIUM 550 MG PO TABS: 550.0000 mg | ORAL_TABLET | Freq: Two times a day (BID) | ORAL | Status: DC

## 2013-08-17 MED ORDER — MECLIZINE HCL 50 MG PO TABS: 50.0000 mg | ORAL_TABLET | Freq: Three times a day (TID) | ORAL | Status: DC | PRN

## 2013-08-17 NOTE — Patient Instructions (Signed)

## 2013-08-17 NOTE — Progress Notes (Signed)
[facility] [address], [place] [id number] [phone]  Date:  08/17/2013   Name:  Logan Glover   DOB:  06/12/1986   MRN:  [id number]  PCP:  No primary provider on file.    Chief Complaint: Dizziness, Thyroid Problem and Back Pain   History of Present Illness:  Logan Glover is a 28 y.o. very pleasant male patient who presents with the following:  Sudden onset of dizziness three days ago.  No history of injury or antecedent illness.  No fever or chills.  No nausea or vomiting.  No headache, cough or coryza.  Says worse with movement and change in axis.  Symptoms recede at rest and when not in motion.  No improvement with over the counter medications or other home remedies. Denies other complaint or health concern today.   There are no active problems to display for this patient.   History reviewed. No pertinent past medical history.  History reviewed. No pertinent past surgical history.  History  Substance Use Topics  . Smoking status: Current Every Day Smoker    Types: Cigarettes  . Smokeless tobacco: Never Used  . Alcohol Use: Yes     Comment: sometimes    No family history on file.  No Known Allergies  Medication list has been reviewed and updated.  Current Outpatient Prescriptions on File Prior to Visit  Medication Sig Dispense Refill  . Dextromethorphan-Guaifenesin (MUCINEX DM) 30-600 MG TB12 Take 1 tablet by mouth 2 (two) times daily.  28 each  0  . fluticasone (FLONASE) 50 MCG/ACT nasal spray Place 2 sprays into both nostrils daily. At bedtime.  16 g  2  . HYDROcodone-homatropine (HYCODAN) 5-1.5 MG/5ML syrup Take 5 mLs by mouth every 8 (eight) hours as needed for cough.  120 mL  0  . ipratropium (ATROVENT) 0.03 % nasal spray Place 2 sprays into the nose 2 (two) times daily.  30 mL  0  . ondansetron (ZOFRAN-ODT) 4 MG disintegrating tablet Take 1 tablet (4 mg total) by mouth every 8 (eight) hours as needed for nausea.  20 tablet  0  .  ranitidine (ZANTAC) 150 MG tablet TAKE 1 TABLET BY MOUTH TWICE A DAY  30 tablet  4   No current facility-administered medications on file prior to visit.    Review of Systems:  .revi   Physical Examination: Filed Vitals:   08/17/13 1630  BP: 112/60  Pulse: 80  Temp: 99 F (37.2 C)  Resp: 16   Filed Vitals:   08/17/13 1630  Height: 5\' 8"  (1.727 m)  Weight: 159 lb (72.122 kg)   Body mass index is 24.18 kg/(m^2). Ideal Body Weight: Weight in (lb) to have BMI = 25: 164.1  GEN: WDWN, NAD, Non-toxic, A & O x 3 HEENT: Atraumatic, Normocephalic. Neck supple. No masses, No LAD.  PRRERLA EOMI   Ears and Nose: No external deformity. CV: RRR, No M/G/R. No JVD. No thrill. No extra heart sounds. PULM: CTA B, no wheezes, crackles, rhonchi. No retractions. No resp. distress. No accessory muscle use. ABD: S, NT, ND, +BS. No rebound. No HSM. EXTR: No c/c/e NEURO Normal gait. CN 2-12  No nystagmus PSYCH: Normally interactive. Conversant. Not depressed or anxious appearing.  Calm demeanor.    Assessment and Plan: Benign positional vertigo antivert   Signed,  Phillips OdorJeffery Jeslin Bazinet, MD

## 2013-09-05 ENCOUNTER — Ambulatory Visit (INDEPENDENT_AMBULATORY_CARE_PROVIDER_SITE_OTHER): Payer: Managed Care, Other (non HMO) | Admitting: Emergency Medicine

## 2013-09-05 VITALS — BP 110/60 | HR 75 | Temp 98.0°F | Resp 16 | Ht 68.0 in | Wt 155.0 lb

## 2013-09-05 DIAGNOSIS — S139XXA Sprain of joints and ligaments of unspecified parts of neck, initial encounter: Secondary | ICD-10-CM

## 2013-09-05 DIAGNOSIS — S335XXA Sprain of ligaments of lumbar spine, initial encounter: Secondary | ICD-10-CM

## 2013-09-05 MED ORDER — NAPROXEN SODIUM 550 MG PO TABS: 550.0000 mg | ORAL_TABLET | Freq: Two times a day (BID) | ORAL | Status: DC

## 2013-09-05 MED ORDER — CYCLOBENZAPRINE HCL 10 MG PO TABS: 10.0000 mg | ORAL_TABLET | Freq: Three times a day (TID) | ORAL | Status: DC | PRN

## 2013-09-05 MED ORDER — TRAMADOL HCL 50 MG PO TABS: 50.0000 mg | ORAL_TABLET | Freq: Three times a day (TID) | ORAL | Status: DC | PRN

## 2013-09-05 NOTE — Progress Notes (Signed)
[facility] [address] [place] [address] [phone]  Date:  09/05/2013   Name:  Logan Glover   DOB:  1985/11/09   MRN:  [id number]  PCP:  No primary provider on file.    Chief Complaint: Back Pain and Depression   History of Present Illness:  Logan Glover is a 28 y.o. very pleasant male patient who presents with the following:  Sudden onset of low back pain since Friday.  Pain often radiates down left leg in morning, but gone after ambulating.  No neuro symptoms or weakness.  No history of injury or overuse.  No improvement with over the counter medications or other home remedies.  Has some symptoms of depression and insomnia since the injury.  No thoughts of harm to self or others.  Some homesickness.   Denies other complaint or health concern today.   There are no active problems to display for this patient.   History reviewed. No pertinent past medical history.  History reviewed. No pertinent past surgical history.  History  Substance Use Topics  . Smoking status: Current Every Day Smoker    Types: Cigarettes  . Smokeless tobacco: Never Used  . Alcohol Use: Yes     Comment: sometimes    History reviewed. No pertinent family history.  No Known Allergies  Medication list has been reviewed and updated.  Current Outpatient Prescriptions on File Prior to Visit  Medication Sig Dispense Refill  . Dextromethorphan-Guaifenesin (MUCINEX DM) 30-600 MG TB12 Take 1 tablet by mouth 2 (two) times daily.  28 each  0  . fluticasone (FLONASE) 50 MCG/ACT nasal spray Place 2 sprays into both nostrils daily. At bedtime.  16 g  2  . HYDROcodone-homatropine (HYCODAN) 5-1.5 MG/5ML syrup Take 5 mLs by mouth every 8 (eight) hours as needed for cough.  120 mL  0  . ipratropium (ATROVENT) 0.03 % nasal spray Place 2 sprays into the nose 2 (two) times daily.  30 mL  0  . meclizine (ANTIVERT) 50 MG tablet Take 1 tablet (50 mg total) by mouth 3 (three) times daily  as needed.  40 tablet  0  . naproxen sodium (ANAPROX DS) 550 MG tablet Take 1 tablet (550 mg total) by mouth 2 (two) times daily with a meal.  40 tablet  0  . ondansetron (ZOFRAN-ODT) 4 MG disintegrating tablet Take 1 tablet (4 mg total) by mouth every 8 (eight) hours as needed for nausea.  20 tablet  0  . ranitidine (ZANTAC) 150 MG tablet TAKE 1 TABLET BY MOUTH TWICE A DAY  30 tablet  4   No current facility-administered medications on file prior to visit.    Review of Systems:  As per HPI, otherwise negative.    Physical Examination: Filed Vitals:   09/05/13 1218  BP: 110/60  Pulse: 75  Temp: 98 F (36.7 C)  Resp: 16   Filed Vitals:   09/05/13 1218  Height: 5\' 8"  (1.727 m)  Weight: 155 lb (70.308 kg)   Body mass index is 23.57 kg/(m^2). Ideal Body Weight: Weight in (lb) to have BMI = 25: 164.1  GEN: WDWN, NAD, Non-toxic, A & O x 3 HEENT: Atraumatic, Normocephalic. Neck supple. No masses, No LAD. Ears and Nose: No external deformity. CV: RRR, No M/G/R. No JVD. No thrill. No extra heart sounds. PULM: CTA B, no wheezes, crackles, rhonchi. No retractions. No resp. distress. No accessory muscle use. ABD: S, NT, ND, +BS. No rebound. No HSM.  EXTR: No c/c/e NEURO Normal gait.  PSYCH: Normally interactive. Conversant. Not depressed or anxious appearing.  Calm demeanor.  BACK:  Tender lumbar paraspinous muscles  Assessment and Plan: Lumbosacral strain Anaprox Flexeril Tylenol #3 Local heat Depression Hold off on meds for time being  Signed,  Phillips OdorJeffery Anderson, MD

## 2013-09-05 NOTE — Patient Instructions (Signed)
Lumbosacral Strain Lumbosacral strain is a strain of any of the parts that make up your lumbosacral vertebrae. Your lumbosacral vertebrae are the bones that make up the lower third of your backbone. Your lumbosacral vertebrae are held together by muscles and tough, fibrous tissue (ligaments).  CAUSES  A sudden blow to your back can cause lumbosacral strain. Also, anything that causes an excessive stretch of the muscles in the low back can cause this strain. This is typically seen when people exert themselves strenuously, fall, lift heavy objects, bend, or crouch repeatedly. RISK FACTORS  Physically demanding work.  Participation in pushing or pulling sports or sports that require sudden twist of the back (tennis, golf, baseball).  Weight lifting.  Excessive lower back curvature.  Forward-tilted pelvis.  Weak back or abdominal muscles or both.  Tight hamstrings. SIGNS AND SYMPTOMS  Lumbosacral strain may cause pain in the area of your injury or pain that moves (radiates) down your leg.  DIAGNOSIS Your health care provider can often diagnose lumbosacral strain through a physical exam. In some cases, you may need tests such as X-ray exams.  TREATMENT  Treatment for your lower back injury depends on many factors that your clinician will have to evaluate. However, most treatment will include the use of anti-inflammatory medicines. HOME CARE INSTRUCTIONS   Avoid hard physical activities (tennis, racquetball, waterskiing) if you are not in proper physical condition for it. This may aggravate or create problems.  If you have a back problem, avoid sports requiring sudden body movements. Swimming and walking are generally safer activities.  Maintain good posture.  Maintain a healthy weight.  For acute conditions, you may put ice on the injured area.  Put ice in a plastic bag.  Place a towel between your skin and the bag.  Leave the ice on for 20 minutes, 2 3 times a day.  When the  low back starts healing, stretching and strengthening exercises may be recommended. SEEK MEDICAL CARE IF:  Your back pain is getting worse.  You experience severe back pain not relieved with medicines. SEEK IMMEDIATE MEDICAL CARE IF:   You have numbness, tingling, weakness, or problems with the use of your arms or legs.  There is a change in bowel or bladder control.  You have increasing pain in any area of the body, including your belly (abdomen).  You notice shortness of breath, dizziness, or feel faint.  You feel sick to your stomach (nauseous), are throwing up (vomiting), or become sweaty.  You notice discoloration of your toes or legs, or your feet get very cold. MAKE SURE YOU:   Understand these instructions.  Will watch your condition.  Will get help right away if you are not doing well or get worse. Document Released: 05/03/2005 Document Revised: 05/14/2013 Document Reviewed: 03/12/2013 Exit[employer] Patient Information 2014 [employer].  

## 2013-09-16 ENCOUNTER — Ambulatory Visit (INDEPENDENT_AMBULATORY_CARE_PROVIDER_SITE_OTHER): Payer: Managed Care, Other (non HMO) | Admitting: Family Medicine

## 2013-09-16 VITALS — BP 130/70 | HR 83 | Temp 97.8°F | Resp 16 | Ht 68.5 in | Wt 157.0 lb

## 2013-09-16 DIAGNOSIS — R6889 Other general symptoms and signs: Secondary | ICD-10-CM

## 2013-09-16 DIAGNOSIS — Z9114 Patient's other noncompliance with medication regimen: Secondary | ICD-10-CM

## 2013-09-16 DIAGNOSIS — Z91199 Patient's noncompliance with other medical treatment and regimen due to unspecified reason: Secondary | ICD-10-CM

## 2013-09-16 DIAGNOSIS — Z9119 Patient's noncompliance with other medical treatment and regimen: Secondary | ICD-10-CM

## 2013-09-16 DIAGNOSIS — J029 Acute pharyngitis, unspecified: Secondary | ICD-10-CM

## 2013-09-16 DIAGNOSIS — R4182 Altered mental status, unspecified: Secondary | ICD-10-CM

## 2013-09-16 LAB — POCT CBC
GRANULOCYTE PERCENT: 79.4 % (ref 37–80)
HCT, POC: 49.7 % (ref 43.5–53.7)
Hemoglobin: 15.7 g/dL (ref 14.1–18.1)
Lymph, poc: 1.6 (ref 0.6–3.4)
MCH: 27.7 pg (ref 27–31.2)
MCHC: 31.6 g/dL — AB (ref 31.8–35.4)
MCV: 87.8 fL (ref 80–97)
MID (CBC): 1.1 — AB (ref 0–0.9)
MPV: 8.8 fL (ref 0–99.8)
PLATELET COUNT, POC: 284 10*3/uL (ref 142–424)
POC Granulocyte: 10.4 — AB (ref 2–6.9)
POC LYMPH %: 12.2 % (ref 10–50)
POC MID %: 8.4 % (ref 0–12)
RBC: 5.66 M/uL (ref 4.69–6.13)
RDW, POC: 13.5 %
WBC: 13.1 10*3/uL — AB (ref 4.6–10.2)

## 2013-09-16 LAB — COMPREHENSIVE METABOLIC PANEL
ALK PHOS: 57 U/L (ref 39–117)
ALT: 23 U/L (ref 0–53)
AST: 39 U/L — AB (ref 0–37)
Albumin: 4.5 g/dL (ref 3.5–5.2)
BILIRUBIN TOTAL: 0.4 mg/dL (ref 0.2–1.2)
BUN: 13 mg/dL (ref 6–23)
CO2: 28 mEq/L (ref 19–32)
CREATININE: 0.87 mg/dL (ref 0.50–1.35)
Calcium: 9.6 mg/dL (ref 8.4–10.5)
Chloride: 101 mEq/L (ref 96–112)
Glucose, Bld: 75 mg/dL (ref 70–99)
Potassium: 4.9 mEq/L (ref 3.5–5.3)
Sodium: 138 mEq/L (ref 135–145)
Total Protein: 7.2 g/dL (ref 6.0–8.3)

## 2013-09-16 LAB — POCT RAPID STREP A (OFFICE): RAPID STREP A SCREEN: NEGATIVE

## 2013-09-16 LAB — POCT INFLUENZA A/B
Influenza A, POC: NEGATIVE
Influenza B, POC: NEGATIVE

## 2013-09-16 MED ORDER — AMOXICILLIN-POT CLAVULANATE 875-125 MG PO TABS: 1.0000 | ORAL_TABLET | Freq: Two times a day (BID) | ORAL | Status: DC

## 2013-09-16 NOTE — Progress Notes (Addendum)
This chart was scribed for Logan Chick, MD by Logan Glover, ED Scribe. This patient was seen in room Room/bed 11 and the patient's care was started at 10:30 AM. Subjective:    Patient ID: Logan Glover, male    DOB: 07/05/1986, 28 y.o.   MRN: [id number]  09/16/2013 Chief Complaint  Patient presents with  . Cough    all  symptoms stareted yesterday  . Dizziness  . Sore Throat   Cough Associated symptoms include chills, ear pain, headaches and a sore throat. Pertinent negatives include no postnasal drip, rash, rhinorrhea, shortness of breath or wheezing.  Dizziness Associated symptoms include chills, coughing, diaphoresis, headaches, nausea, a sore throat and vomiting. Pertinent negatives include no rash.  Sore Throat  Associated symptoms include coughing, ear pain, headaches and vomiting. Pertinent negatives include no diarrhea, shortness of breath, stridor or trouble swallowing.   Logan Glover is a 28 y.o. male who presents with friend to the [facility] complaining of ongoing productive cough, sore throat and dizziness with associated HA, chills, diaphoresis that began yesterday evening. Pt states he took hydrocodone cough syrup (rx last year) last night with little relief. He states when he woke up this morning, his voice was hoarse and reports having a sore throat. Pt states he has had an episode of emesis that occurred prior to eating 2 eggs for breakfast. He states he has been having pressure in the head. Pt states he "feels as he may be drunk" and states he took half a bottle of hydrocodone cough syrup, last taken at midnight. Pt states he has been having ear pain when swallowing. Pt denies drinking alcohol or any other OTC medications last night. Pt denies rhinorrhea, SOB, and diarrhea.  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 Pt states he is from [place] and [place] past [place]. He reports that he stopped smoking 3 months ago. Pt denies drinking alcohol. He  denies having any chronic health conditions.  Review of Systems  Constitutional: Positive for chills and diaphoresis.  HENT: Positive for ear pain, sore throat and voice change. Negative for postnasal drip, rhinorrhea and trouble swallowing.   Respiratory: Positive for cough. Negative for shortness of breath, wheezing and stridor.   Gastrointestinal: Positive for nausea and vomiting. Negative for diarrhea.  Skin: Negative for rash.  Neurological: Positive for dizziness and headaches.  Psychiatric/Behavioral: Negative for confusion.    Past Medical History  Diagnosis Date  . IBS (irritable bowel syndrome)     dx in [place]   No Known Allergies Current Outpatient Prescriptions  Medication Sig Dispense Refill  . esomeprazole (NEXIUM) 20 MG capsule Take 1 capsule (20 mg total) by mouth daily at 12 noon. 30 capsule 0  . hyoscyamine (LEVBID) 0.375 MG 12 hr tablet Take 1 tablet (0.375 mg total) by mouth every 12 (twelve) hours as needed for cramping. 60 tablet 0  . ondansetron (ZOFRAN ODT) 8 MG disintegrating tablet Take 1 tablet (8 mg total) by mouth every 8 (eight) hours as needed for nausea or vomiting. 20 tablet 0  . ranitidine (ZANTAC) 150 MG tablet Take 150 mg by mouth daily as needed for heartburn.    . sucralfate (CARAFATE) 1 G tablet Take 1 tablet (1 g total) by mouth 4 (four) times daily. 30 tablet 0  . [DISCONTINUED] omeprazole (PRILOSEC) 20 MG capsule Take 1 capsule (20 mg total) by mouth daily. 30 capsule 5   No current facility-administered medications for this visit.  Objective:    BP 130/70 mmHg  Pulse 83  Temp(Src) 97.8 F (36.6 C) (Oral)  Resp 16  Ht 5' 8.5" (1.74 m)  Wt 157 lb (71.215 kg)  BMI 23.52 kg/m2  SpO2 97%  Physical Exam  Constitutional: He is oriented to person, place, and time. He appears well-developed and well-nourished. No distress.  HENT:  Head: Normocephalic and atraumatic.  Right Ear: External ear normal.  Left Ear: External ear  normal.  Throat is diffusely erythematous.   Eyes: Conjunctivae and EOM are normal. Pupils are equal, round, and reactive to light.  Neck: Neck supple. No tracheal deviation present.  Cardiovascular: Normal rate, regular rhythm and normal heart sounds.  Exam reveals no gallop and no friction rub.   No murmur heard. Pulmonary/Chest: Effort normal and breath sounds normal. No respiratory distress. He has no wheezes. He has no rales.  Abdominal: Soft. Bowel sounds are normal. There is no tenderness.  Musculoskeletal: Normal range of motion.  Neurological: He is alert and oriented to person, place, and time.  Speech is slurred.  Drowsy in exam room.  Skin: Skin is warm and dry.  Psychiatric: His speech is slurred. He is slowed. Cognition and memory are impaired.  Nursing note and vitals reviewed.  Results for orders placed or performed in visit on 09/16/13  Culture, Group A Strep  Result Value Ref Range   Organism ID, Bacteria Normal Upper Respiratory Flora    Organism ID, Bacteria No Beta Hemolytic Streptococci Isolated   Comprehensive metabolic panel  Result Value Ref Range   Sodium 138 135 - 145 mEq/L   Potassium 4.9 3.5 - 5.3 mEq/L   Chloride 101 96 - 112 mEq/L   CO2 28 19 - 32 mEq/L   Glucose, Bld 75 70 - 99 mg/dL   BUN 13 6 - 23 mg/dL   Creat 1.61 0.96 - 0.45 mg/dL   Total Bilirubin 0.4 0.2 - 1.2 mg/dL   Alkaline Phosphatase 57 39 - 117 U/L   AST 39 (H) 0 - 37 U/L   ALT 23 0 - 53 U/L   Total Protein 7.2 6.0 - 8.3 g/dL   Albumin 4.5 3.5 - 5.2 g/dL   Calcium 9.6 8.4 - 40.9 mg/dL  POCT Influenza A/B  Result Value Ref Range   Influenza A, POC Negative    Influenza B, POC Negative   POCT rapid strep A  Result Value Ref Range   Rapid Strep A Screen Negative Negative  POCT CBC  Result Value Ref Range   WBC 13.1 (A) 4.6 - 10.2 K/uL   Lymph, poc 1.6 0.6 - 3.4   POC LYMPH PERCENT 12.2 10 - 50 %L   MID (cbc) 1.1 (A) 0 - 0.9   POC MID % 8.4 0 - 12 %M   POC Granulocyte 10.4 (A)  2 - 6.9   Granulocyte percent 79.4 37 - 80 %G   RBC 5.66 4.69 - 6.13 M/uL   Hemoglobin 15.7 14.1 - 18.1 g/dL   HCT, POC 81.1 91.4 - 53.7 %   MCV 87.8 80 - 97 fL   MCH, POC 27.7 27 - 31.2 pg   MCHC 31.6 (A) 31.8 - 35.4 g/dL   RDW, POC 78.2 %   Platelet Count, POC 284 142 - 424 K/uL   MPV 8.8 0 - 99.8 fL   2 liters ivf administered in office.     Assessment & Plan:  Flu-like symptoms - Plan: POCT Influenza A/B, POCT rapid strep A, POCT  CBC, Culture, Group A Strep  Altered mental status - Plan: POCT CBC  Overuse of medication - Plan: POCT CBC, Comprehensive metabolic panel  Acute pharyngitis   1. Acute pharyngitis/URI:  New.  Recommend supportive care with rest, fluids, Ibuprofen, OTC cough medication only.  If no improvement in 72 hours, start Augmentin. 2.  Altered mental status: New. Secondary to overuse of Tussionex cough syrup. S/p ivf in office and observation; improvement in mental status during visit. Obtain labs.  Avoid driving for 24 hours. 3.  Overuse of medication:  New.  Drank several ounces of Tussionex overnight with altered mental status; s/p 2 liters ivf in office; mental status improved with hydration.  Advised to avoid driving for 24 hours.    Meds ordered this encounter  Medications  . DISCONTD: amoxicillin-clavulanate (AUGMENTIN) 875-125 MG per tablet    Sig: Take 1 tablet by mouth 2 (two) times daily.    Dispense:  20 tablet    Refill:  0    No Follow-up on file.    I personally performed the services described in this documentation, which was scribed in my presence.  The recorded information has been reviewed and is accurate.  Nilda SimmerKristi Katherinne Mofield, M.D.  [facility]  [facility] [address] [place], [place]  [address] [phone] phone [phone] fax

## 2013-09-16 NOTE — Patient Instructions (Signed)
1.  DO NOT DRIVE FOR THE  REMAINDER OF THE DAY. 2.  DO NOT TAKE ANY MORE COUGH SYRUP TODAY. 3. RETURN IF YOU WORSEN.

## 2013-09-18 LAB — CULTURE, GROUP A STREP: Organism ID, Bacteria: NORMAL

## 2013-09-22 ENCOUNTER — Encounter: Payer: Self-pay | Admitting: Family Medicine

## 2013-10-21 ENCOUNTER — Ambulatory Visit: Payer: Managed Care, Other (non HMO)

## 2013-11-19 ENCOUNTER — Ambulatory Visit (INDEPENDENT_AMBULATORY_CARE_PROVIDER_SITE_OTHER): Payer: Managed Care, Other (non HMO) | Admitting: Family Medicine

## 2013-11-19 ENCOUNTER — Ambulatory Visit: Payer: Managed Care, Other (non HMO)

## 2013-11-19 VITALS — BP 120/80 | HR 71 | Temp 98.0°F | Resp 16 | Ht 68.5 in | Wt 163.0 lb

## 2013-11-19 DIAGNOSIS — R109 Unspecified abdominal pain: Secondary | ICD-10-CM

## 2013-11-19 DIAGNOSIS — K589 Irritable bowel syndrome without diarrhea: Secondary | ICD-10-CM

## 2013-11-19 LAB — POCT CBC
Granulocyte percent: 59.5 %G (ref 37–80)
HCT, POC: 47.4 % (ref 43.5–53.7)
HEMOGLOBIN: 15.2 g/dL (ref 14.1–18.1)
LYMPH, POC: 1.5 (ref 0.6–3.4)
MCH: 27.4 pg (ref 27–31.2)
MCHC: 32.1 g/dL (ref 31.8–35.4)
MCV: 85.6 fL (ref 80–97)
MID (CBC): 0.4 (ref 0–0.9)
MPV: 8.6 fL (ref 0–99.8)
PLATELET COUNT, POC: 312 10*3/uL (ref 142–424)
POC GRANULOCYTE: 2.7 (ref 2–6.9)
POC LYMPH %: 32.7 % (ref 10–50)
POC MID %: 7.8 % (ref 0–12)
RBC: 5.54 M/uL (ref 4.69–6.13)
RDW, POC: 13.8 %
WBC: 4.6 10*3/uL (ref 4.6–10.2)

## 2013-11-19 MED ORDER — DICYCLOMINE HCL 20 MG PO TABS: ORAL_TABLET | ORAL | Status: DC

## 2013-11-19 NOTE — Progress Notes (Signed)
[facility] [address], [place] [address] [phone]  Date:  11/19/2013   Name:  Logan Glover   DOB:  03/08/1986   MRN:  [id number]  PCP:  No primary provider on file.    Chief Complaint: Abdominal Pain   History of Present Illness:  Logan Glover is a 28 y.o. very pleasant male patient who presents with the following:  He has a history of IBS.  For the last 3 days he has noted that his stomach feels "full," and this causes him to be in "a bad mood."  He notes that he will have more abdominal pain after eating some of the time.  No vomiting.  He felt nauseated a few days ago- not now.    He is still eating, but he does not feel much like eating.   He is not constipated per his report.    He has had this sort of problem in the past- more when he was living at home in [place].  However [place] he has done better until recently  He has not tried any medication for this recently.  When he was home he sometiems used a medication but we are not sure what this was.   There are no active problems to display for this patient.   History reviewed. No pertinent past medical history.  History reviewed. No pertinent past surgical history.  History  Substance Use Topics  . Smoking status: Former Smoker    Types: Cigarettes  . Smokeless tobacco: Former NeurosurgeonUser    Quit date: 06/07/2013  . Alcohol Use: Yes     Comment: sometimes    History reviewed. No pertinent family history.  No Known Allergies  Medication list has been reviewed and updated.  Current Outpatient Prescriptions on File Prior to Visit  Medication Sig Dispense Refill  . amoxicillin-clavulanate (AUGMENTIN) 875-125 MG per tablet Take 1 tablet by mouth 2 (two) times daily.  20 tablet  0  . HYDROcodone-homatropine (HYCODAN) 5-1.5 MG/5ML syrup Take 5 mLs by mouth every 8 (eight) hours as needed for cough.  120 mL  0   No current facility-administered  medications on file prior to visit.    Review of Systems:  As per HPI- otherwise negative.   Physical Examination: Filed Vitals:   11/19/13 1551  BP: 120/80  Pulse: 71  Temp: 98 F (36.7 C)  Resp: 16   Filed Vitals:   11/19/13 1551  Height: 5' 8.5" (1.74 m)  Weight: 163 lb (73.936 kg)   Body mass index is 24.42 kg/(m^2). Ideal Body Weight: Weight in (lb) to have BMI = 25: 166.5  GEN: WDWN, NAD, Non-toxic, A & O x 3, looks wewll HEENT: Atraumatic, Normocephalic. Neck supple. No masses, No LAD.  Bilateral TM wnl, oropharynx normal.  PEERL,EOMI.   Ears and Nose: No external deformity. CV: RRR, No M/G/R. No JVD. No thrill. No extra heart sounds. PULM: CTA B, no wheezes, crackles, rhonchi. No retractions. No resp. distress. No accessory muscle use. ABD: S, NT, ND, +BS. No rebound. No HSM.  Benign exam EXTR: No c/c/e NEURO Normal gait.  PSYCH: Normally interactive. Conversant. Not depressed or anxious appearing.  Calm demeanor.   [facility] reading (PRIMARY) by  Dr. Patsy Lageropland. abd series: non- specific, non- obstructive bowel gas pattern  ABDOMEN - 2 VIEW  COMPARISON: None.  FINDINGS: Bowel gas pattern is nonobstructive with mild fecal retention throughout the colon. No  evidence of free air or air-fluid levels. No dilated small bowel loops. Bones and soft tissues are unremarkable.  IMPRESSION: Nonobstructive bowel gas pattern with mild fecal retention throughout the colon.  Results for orders placed in visit on 11/19/13  POCT CBC      Result Value Ref Range   WBC 4.6  4.6 - 10.2 K/uL   Lymph, poc 1.5  0.6 - 3.4   POC LYMPH PERCENT 32.7  10 - 50 %L   MID (cbc) 0.4  0 - 0.9   POC MID % 7.8  0 - 12 %M   POC Granulocyte 2.7  2 - 6.9   Granulocyte percent 59.5  37 - 80 %G   RBC 5.54  4.69 - 6.13 M/uL   Hemoglobin 15.2  14.1 - 18.1 g/dL   HCT, POC 11.947.4  14.743.5 - 53.7 %   MCV 85.6  80 - 97 fL   MCH, POC 27.4  27 - 31.2 pg   MCHC 32.1  31.8 - 35.4 g/dL   RDW, POC 82.913.8      Platelet Count, POC 312  142 - 424 K/uL   MPV 8.6  0 - 99.8 fL   Assessment and Plan: Abdominal discomfort - Plan: POCT CBC, Comprehensive metabolic panel, Amylase, Lipase, DG Abd 2 Views, dicyclomine (BENTYL) 20 MG tablet  IBS (irritable bowel syndrome)  Logan Glover is here today with likely exacerbation of his IBS sx.  His CBC and exam are reassuring that he does not have any dangerous pathology.  Will try bentyl prn for his sx, await CMP and pancreatic enzymes.  Did give him a note for school as per his request If any severe pain, fever, anorexia, vomiting, or other concerning sx he is to seek care right away- he states understanding.   Signed Abbe AmsterdamJessica Copland, MD

## 2013-11-19 NOTE — Patient Instructions (Signed)
Use the bentyl as needed for your abdominal symptoms.  If you are feeling worse please let me know.  Otherwise I will be in touch with the rest of your labs. Bentyl is for irritable bowel syndrome- this is a condition where people have sensitive stomachs with nausea, diarrhea, pain and constipation.

## 2013-11-20 ENCOUNTER — Encounter: Payer: Self-pay | Admitting: Family Medicine

## 2013-11-20 LAB — COMPREHENSIVE METABOLIC PANEL
ALK PHOS: 64 U/L (ref 39–117)
ALT: 12 U/L (ref 0–53)
AST: 13 U/L (ref 0–37)
Albumin: 4.6 g/dL (ref 3.5–5.2)
BILIRUBIN TOTAL: 0.3 mg/dL (ref 0.2–1.2)
BUN: 14 mg/dL (ref 6–23)
CO2: 26 meq/L (ref 19–32)
CREATININE: 0.88 mg/dL (ref 0.50–1.35)
Calcium: 9.8 mg/dL (ref 8.4–10.5)
Chloride: 105 mEq/L (ref 96–112)
Glucose, Bld: 93 mg/dL (ref 70–99)
Potassium: 4.4 mEq/L (ref 3.5–5.3)
Sodium: 141 mEq/L (ref 135–145)
Total Protein: 6.9 g/dL (ref 6.0–8.3)

## 2013-11-20 LAB — LIPASE: Lipase: 31 U/L (ref 0–75)

## 2013-11-20 LAB — AMYLASE: Amylase: 43 U/L (ref 0–105)

## 2013-11-27 ENCOUNTER — Ambulatory Visit (INDEPENDENT_AMBULATORY_CARE_PROVIDER_SITE_OTHER): Payer: Managed Care, Other (non HMO) | Admitting: Family Medicine

## 2013-11-27 VITALS — BP 104/78 | HR 72 | Temp 97.9°F | Resp 16 | Ht 68.5 in | Wt 164.6 lb

## 2013-11-27 DIAGNOSIS — R1013 Epigastric pain: Secondary | ICD-10-CM

## 2013-11-27 DIAGNOSIS — R112 Nausea with vomiting, unspecified: Secondary | ICD-10-CM

## 2013-11-27 DIAGNOSIS — K219 Gastro-esophageal reflux disease without esophagitis: Secondary | ICD-10-CM

## 2013-11-27 MED ORDER — OMEPRAZOLE 20 MG PO CPDR
20.0000 mg | DELAYED_RELEASE_CAPSULE | Freq: Every day | ORAL | Status: DC
Start: 2013-11-27 — End: 2013-12-12

## 2013-11-27 NOTE — Progress Notes (Signed)
Chief Complaint:  Chief Complaint  Patient presents with  . Abdominal Pain    X 1 DAY  . Emesis    X 1 DAY     HPI: Logan Glover is a 28 y.o. male who is here for  Abdominal pain and nonbloody emesis after he had food at [employer] and had chocolate. He threw up 3 times yesterday and  Also today x 1. He has eaten eggs today and also been drinking. NO diarrhea today. He states he has been dx in [place] with IBS and was not put on meds for it because there was nothing to take, he has changed his diet to not include sodas. However yesterday he drank a lot of caffeine and also ate a lot of chocolate. He  Has had flatus, deneis fevers, chills. No nausea today. Has had good po intake today   Past Medical History  Diagnosis Date  . IBS (irritable bowel syndrome)     dx in [place]   History reviewed. No pertinent past surgical history. History   Social History  . Marital Status: Single    Spouse Name: N/A    Number of Children: N/A  . Years of Education: N/A   Social History Main Topics  . Smoking status: Former Smoker    Types: Cigarettes  . Smokeless tobacco: Former NeurosurgeonUser    Quit date: 06/07/2013  . Alcohol Use: Yes     Comment: sometimes  . Drug Use: No  . Sexual Activity: None   Other Topics Concern  . None   Social History Narrative  . None   History reviewed. No pertinent family history. No Known Allergies Prior to Admission medications   Not on File     ROS: The patient denies fevers, chills, night sweats, unintentional weight loss, chest pain, palpitations, wheezing, dyspnea on exertion,  dysuria, hematuria, melena, numbness, weakness, or tingling.   All other systems have been reviewed and were otherwise negative with the exception of those mentioned in the HPI and as above.    PHYSICAL EXAM: Filed Vitals:   11/27/13 1544  BP: 104/78  Pulse: 72  Temp: 97.9 F (36.6 C)  Resp: 16   Filed Vitals:   11/27/13 1544  Height: 5' 8.5" (1.74  m)  Weight: 164 lb 9.6 oz (74.662 kg)   Body mass index is 24.66 kg/(m^2).  General: Alert, no acute distress HEENT:  Normocephalic, atraumatic, oropharynx patent. EOMI, PERRLA, TM nl, no exudates. Oral mucosa normal Cardiovascular:  Regular rate and rhythm, no rubs murmurs or gallops.  No Carotid bruits, radial pulse intact. No pedal edema.  Respiratory: Clear to auscultation bilaterally.  No wheezes, rales, or rhonchi.  No cyanosis, no use of accessory musculature GI: No organomegaly, abdomen is soft and non-tender, positive bowel sounds.  No masses. Skin: No rashes. Neurologic: Facial musculature symmetric. Psychiatric: Patient is appropriate throughout our interaction. Lymphatic: No cervical lymphadenopathy Musculoskeletal: Gait intact.   LABS: Results for orders placed in visit on 11/19/13  COMPREHENSIVE METABOLIC PANEL      Result Value Ref Range   Sodium 141  135 - 145 mEq/L   Potassium 4.4  3.5 - 5.3 mEq/L   Chloride 105  96 - 112 mEq/L   CO2 26  19 - 32 mEq/L   Glucose, Bld 93  70 - 99 mg/dL   BUN 14  6 - 23 mg/dL   Creat 4.090.88  8.110.50 - 9.141.35 mg/dL   Total Bilirubin 0.3  0.2 - 1.2 mg/dL   Alkaline Phosphatase 64  39 - 117 U/L   AST 13  0 - 37 U/L   ALT 12  0 - 53 U/L   Total Protein 6.9  6.0 - 8.3 g/dL   Albumin 4.6  3.5 - 5.2 g/dL   Calcium 9.8  8.4 - 16.110.5 mg/dL  AMYLASE      Result Value Ref Range   Amylase 43  0 - 105 U/L  LIPASE      Result Value Ref Range   Lipase 31  0 - 75 U/L  POCT CBC      Result Value Ref Range   WBC 4.6  4.6 - 10.2 K/uL   Lymph, poc 1.5  0.6 - 3.4   POC LYMPH PERCENT 32.7  10 - 50 %L   MID (cbc) 0.4  0 - 0.9   POC MID % 7.8  0 - 12 %M   POC Granulocyte 2.7  2 - 6.9   Granulocyte percent 59.5  37 - 80 %G   RBC 5.54  4.69 - 6.13 M/uL   Hemoglobin 15.2  14.1 - 18.1 g/dL   HCT, POC 09.647.4  04.543.5 - 53.7 %   MCV 85.6  80 - 97 fL   MCH, POC 27.4  27 - 31.2 pg   MCHC 32.1  31.8 - 35.4 g/dL   RDW, POC 40.913.8     Platelet Count, POC 312  142  - 424 K/uL   MPV 8.6  0 - 99.8 fL     EKG/XRAY:   [facility]   ASSESSMENT/PLAN: Encounter Diagnoses  Name Primary?  . GERD (gastroesophageal reflux disease) Yes  . Abdominal pain, epigastric   . Nausea and vomiting     28 y/o [place] male who has a h.o IBS not on meds who is here with GERD sxs. He has not had fevers or chills. Triggered by an abundance of chocolate, caffeine intake and [employer].  He is improved today. This may be GERD with viral GI sxs. Rcent labs and abd xray were normal last week on 11/19/2013  He is able to push fluids  At home Rx Prilosec 20 mg daily F/u prn Gross sideeffects, risk and benefits, and alternatives of medications d/w patient. Patient is aware that all medications have potential sideeffects and we are unable to predict every sideeffect or drug-drug interaction that may occur.  Lenell Antuhao P Devita Nies, DO 11/27/2013 4:34 PM

## 2013-11-27 NOTE — Patient Instructions (Signed)

## 2013-12-12 ENCOUNTER — Encounter (HOSPITAL_COMMUNITY): Payer: Self-pay | Admitting: Emergency Medicine

## 2013-12-12 ENCOUNTER — Emergency Department (HOSPITAL_COMMUNITY)
Admission: EM | Admit: 2013-12-12 | Discharge: 2013-12-12 | Disposition: A | Discharge status: Home / Self Care | Payer: Managed Care, Other (non HMO) | Attending: Emergency Medicine | Admitting: Emergency Medicine

## 2013-12-12 DIAGNOSIS — K219 Gastro-esophageal reflux disease without esophagitis: Secondary | ICD-10-CM | POA: Insufficient documentation

## 2013-12-12 DIAGNOSIS — Z79899 Other long term (current) drug therapy: Secondary | ICD-10-CM | POA: Insufficient documentation

## 2013-12-12 DIAGNOSIS — Z87891 Personal history of nicotine dependence: Secondary | ICD-10-CM | POA: Insufficient documentation

## 2013-12-12 LAB — CBC
HCT: 44.4 % (ref 39.0–52.0)
Hemoglobin: 15.1 g/dL (ref 13.0–17.0)
MCH: 27.5 pg (ref 26.0–34.0)
MCHC: 34 g/dL (ref 30.0–36.0)
MCV: 80.7 fL (ref 78.0–100.0)
PLATELETS: 261 10*3/uL (ref 150–400)
RBC: 5.5 MIL/uL (ref 4.22–5.81)
RDW: 12.7 % (ref 11.5–15.5)
WBC: 6.7 10*3/uL (ref 4.0–10.5)

## 2013-12-12 LAB — BASIC METABOLIC PANEL
BUN: 10 mg/dL (ref 6–23)
CO2: 28 mEq/L (ref 19–32)
Calcium: 9.5 mg/dL (ref 8.4–10.5)
Chloride: 103 mEq/L (ref 96–112)
Creatinine, Ser: 0.86 mg/dL (ref 0.50–1.35)
GFR calc Af Amer: >90 mL/min (ref >90)
GFR calc non Af Amer: >90 mL/min (ref >90)
Glucose, Bld: 100 mg/dL — ABNORMAL HIGH (ref 70–99)
Potassium: 4.1 mEq/L (ref 3.7–5.3)
Sodium: 142 mEq/L (ref 137–147)

## 2013-12-12 LAB — I-STAT TROPONIN, ED: Troponin i, poc: 0.01 ng/mL (ref 0.00–0.08)

## 2013-12-12 MED ORDER — ONDANSETRON 8 MG PO TBDP: 8.0000 mg | ORAL_TABLET | Freq: Three times a day (TID) | ORAL | Status: AC | PRN

## 2013-12-12 MED ORDER — SUCRALFATE 1 G PO TABS: 1.0000 g | ORAL_TABLET | Freq: Four times a day (QID) | ORAL | Status: AC

## 2013-12-12 MED ORDER — PANTOPRAZOLE SODIUM 40 MG PO TBEC
40.0000 mg | DELAYED_RELEASE_TABLET | Freq: Once | ORAL | Status: AC
  Administered 2013-12-12: 40 mg via ORAL
  Filled 2013-12-12: qty 1

## 2013-12-12 MED ORDER — ESOMEPRAZOLE MAGNESIUM 20 MG PO CPDR: 20.0000 mg | DELAYED_RELEASE_CAPSULE | Freq: Every day | ORAL | Status: AC

## 2013-12-12 MED ORDER — GI COCKTAIL ~~LOC~~
30.0000 mL | Freq: Once | ORAL | Status: AC
  Administered 2013-12-12: 30 mL via ORAL
  Filled 2013-12-12: qty 30

## 2013-12-12 MED ORDER — HYOSCYAMINE SULFATE ER 0.375 MG PO TB12: 0.3750 mg | ORAL_TABLET | Freq: Two times a day (BID) | ORAL | Status: AC | PRN

## 2013-12-12 MED ORDER — SUCRALFATE 1 G PO TABS
1.0000 g | ORAL_TABLET | Freq: Once | ORAL | Status: AC
  Administered 2013-12-12: 1 g via ORAL
  Filled 2013-12-12: qty 1

## 2013-12-12 NOTE — ED Notes (Signed)
PT states that he began to have chest pain around 8pm last night; pt describes is at as a soreness; pt c/o shortness of breath, dizziness and nausea

## 2013-12-12 NOTE — ED Notes (Signed)
EKG given to EDP, Otter,MD. For review. 

## 2013-12-12 NOTE — ED Provider Notes (Signed)
CSN: [id number]     Arrival date & time 12/12/13  [id number] History   First MD Initiated Contact with Patient 12/12/13 0421     Chief Complaint  Patient presents with  . Chest Pain     (Consider location/radiation/quality/duration/timing/severity/associated sxs/prior Treatment) HPI 28 year old male presents to the emergency department with complaint of epigastric pain, chest pain, and acid burning in the back of her throat.  Symptoms have been ongoing for last 2 days.  Patient reports symptoms worsened tonight after having a beer.  Patient reports he's had similar problems in the past.  He noticed yesterday that started, seemed to get better after having a cup of coffee and drinking some water.  He denies any nausea or vomiting.  He denies any shortness of breath.  He describes pain as a burning soreness that extends from the stomach up to the back of his throat.  He denies any shortness of breath.  He has had nausea without vomiting.  Has had mild dizziness.  No prolonged immobilization, no prolonged trips, no leg swelling or pain.  He has no risk factors for PE.  No family history of early cardiac issues.  He reports past history of irritable bowel syndrome Past Medical History  Diagnosis Date  . IBS (irritable bowel syndrome)     [place]   History reviewed. No pertinent past surgical history. No family history on file. History  Substance Use Topics  . Smoking status: Former Smoker    Types: Cigarettes  . Smokeless tobacco: Former NeurosurgeonUser    Quit date: 06/07/2013  . Alcohol Use: Yes     Comment: sometimes    Review of Systems   See History of Present Illness; otherwise all other systems are reviewed and negative  Allergies  Review of patient's allergies indicates no known allergies.  Home Medications   Prior to Admission medications   Medication Sig Start Date End Date Taking? Authorizing Provider  ranitidine (ZANTAC) 150 MG tablet Take 150 mg by mouth daily as needed for  heartburn.   Yes Historical Provider, MD  omeprazole (PRILOSEC) 20 MG capsule Take 1 capsule (20 mg total) by mouth daily. 11/27/13   Thao P Le, DO   BP 121/57  Pulse 65  Temp(Src) 98.4 F (36.9 C) (Oral)  Resp 16  SpO2 100% Physical Exam  Nursing note and vitals reviewed. Constitutional: He is oriented to person, place, and time. He appears well-developed and well-nourished. He appears distressed (uncomfortable appearing).  HENT:  Head: Normocephalic and atraumatic.  Nose: Nose normal.  Mouth/Throat: Oropharynx is clear and moist.  Eyes: Conjunctivae and EOM are normal. Pupils are equal, round, and reactive to light.  Neck: Normal range of motion. Neck supple. No JVD present. No tracheal deviation present. No thyromegaly present.  Cardiovascular: Normal rate, regular rhythm, normal heart sounds and intact distal pulses.  Exam reveals no gallop and no friction rub.   No murmur heard. Pulmonary/Chest: Effort normal and breath sounds normal. No stridor. No respiratory distress. He has no wheezes. He has no rales. He exhibits no tenderness.  Abdominal: Soft. Bowel sounds are normal. He exhibits no distension and no mass. There is tenderness (mild epigastric pain). There is no rebound and no guarding.  Musculoskeletal: Normal range of motion. He exhibits no edema and no tenderness.  Lymphadenopathy:    He has no cervical adenopathy.  Neurological: He is alert and oriented to person, place, and time. He exhibits normal muscle tone. Coordination normal.  Skin: Skin  is warm and dry. No rash noted. No erythema. No pallor.  Psychiatric: He has a normal mood and affect. His behavior is normal. Judgment and thought content normal.    ED Course  Procedures (including critical care time) Labs Review Labs Reviewed  BASIC METABOLIC PANEL - Abnormal; Notable for the following:    Glucose, Bld 100 (*)    All other components within normal limits  CBC  I-STAT TROPOININ, ED    Imaging Review No  results found.   EKG Interpretation   Date/Time:  Friday Dec 12 2013 03:34:38 EDT Ventricular Rate:  73 PR Interval:  127 QRS Duration: 103 QT Interval:  390 QTC Calculation: 430 R Axis:   92 Text Interpretation:  Sinus rhythm Borderline right axis deviation ST  elev, probable normal early repol pattern Baseline wander in lead(s) V5 No  old tracing to compare Confirmed by Vi Biddinger  MD, Debralee Braaksma ([id number]) on 12/12/2013  3:49:35 AM      MDM   Final diagnoses:  GERD (gastroesophageal reflux disease)    28 year old male with probable GERD.  Plan for GI cocktail, Protonix, Carafate.  Labs and EKG are unremarkable.   Olivia Mackielga M Cache Bills, MD 12/12/13 [phone]

## 2013-12-12 NOTE — Discharge Instructions (Signed)
Take medications as prescribed.  Stick to the GERD diet!  Fill your medications!!  You may either take the prevacid prescribed last week or the nexium prescribed today.  Stop smoking, avoid caffeine, chocolate, coffee, alcohol.   Diet for Gastroesophageal Reflux Disease, Adult Reflux (acid reflux) is when acid from your stomach flows up into the esophagus. When acid comes in contact with the esophagus, the acid causes irritation and soreness (inflammation) in the esophagus. When reflux happens often or so severely that it causes damage to the esophagus, it is called gastroesophageal reflux disease (GERD). Nutrition therapy can help ease the discomfort of GERD. FOODS OR DRINKS TO AVOID OR LIMIT  Smoking or chewing tobacco. Nicotine is one of the most potent stimulants to acid production in the gastrointestinal tract.  Caffeinated and decaffeinated coffee and black tea.  Regular or low-calorie carbonated beverages or energy drinks (caffeine-free carbonated beverages are allowed).   Strong spices, such as black pepper, white pepper, red pepper, cayenne, curry powder, and chili powder.  Peppermint or spearmint.  Chocolate.  High-fat foods, including meats and fried foods. Extra added fats including oils, butter, salad dressings, and nuts. Limit these to less than 8 tsp per day.  Fruits and vegetables if they are not tolerated, such as citrus fruits or tomatoes.  Alcohol.  Any food that seems to aggravate your condition. If you have questions regarding your diet, call your caregiver or a registered dietitian. OTHER THINGS THAT MAY HELP GERD INCLUDE:   Eating your meals slowly, in a relaxed setting.  Eating 5 to 6 small meals per day instead of 3 large meals.  Eliminating food for a period of time if it causes distress.  Not lying down until 3 hours after eating a meal.  Keeping the head of your bed raised 6 to 9 inches (15 to 23 cm) by using a foam wedge or blocks under the legs of  the bed. Lying flat may make symptoms worse.  Being physically active. Weight loss may be helpful in reducing reflux in overweight or obese adults.  Wear loose fitting clothing EXAMPLE MEAL PLAN This meal plan is approximately 2,000 calories based on https://www.[place].org/ChooseMyPlate.gov meal planning guidelines. Breakfast   cup cooked oatmeal.  1 cup strawberries.  1 cup low-fat milk.  1 oz almonds. Snack  1 cup cucumber slices.  6 oz yogurt (made from low-fat or fat-free milk). Lunch  2 slice whole-wheat bread.  2 oz sliced [place].  2 tsp mayonnaise.  1 cup blueberries.  1 cup snap peas. Snack  6 whole-wheat crackers.  1 oz string cheese. Dinner   cup brown rice.  1 cup mixed veggies.  1 tsp olive oil.  3 oz grilled fish. Document Released: 07/24/2005 Document Revised: 10/16/2011 Document Reviewed: 06/09/2011 [facility] Patient Information 2014 [place], [place].  Gastroesophageal Reflux Disease, Adult Gastroesophageal reflux disease (GERD) happens when acid from your stomach flows up into the esophagus. When acid comes in contact with the esophagus, the acid causes soreness (inflammation) in the esophagus. Over time, GERD may create small holes (ulcers) in the lining of the esophagus. CAUSES   Increased body weight. This puts pressure on the stomach, making acid rise from the stomach into the esophagus.  Smoking. This increases acid production in the stomach.  Drinking alcohol. This causes decreased pressure in the lower esophageal sphincter (valve or ring of muscle between the esophagus and stomach), allowing acid from the stomach into the esophagus.  Late evening meals and a full stomach. This increases  pressure and acid production in the stomach.  A malformed lower esophageal sphincter. Sometimes, no cause is found. SYMPTOMS   Burning pain in the lower part of the mid-chest behind the breastbone and in the mid-stomach area. This may occur twice a week or more  often.  Trouble swallowing.  Sore throat.  Dry cough.  Asthma-like symptoms including chest tightness, shortness of breath, or wheezing. DIAGNOSIS  Your caregiver may be able to diagnose GERD based on your symptoms. In some cases, X-rays and other tests may be done to check for complications or to check the condition of your stomach and esophagus. TREATMENT  Your caregiver may recommend over-the-counter or prescription medicines to help decrease acid production. Ask your caregiver before starting or adding any new medicines.  HOME CARE INSTRUCTIONS   Change the factors that you can control. Ask your caregiver for guidance concerning weight loss, quitting smoking, and alcohol consumption.  Avoid foods and drinks that make your symptoms worse, such as:  Caffeine or alcoholic drinks.  Chocolate.  Peppermint or mint flavorings.  Garlic and onions.  Spicy foods.  Citrus fruits, such as oranges, lemons, or limes.  Tomato-based foods such as sauce, chili, salsa, and pizza.  Fried and fatty foods.  Avoid lying down for the 3 hours prior to your bedtime or prior to taking a nap.  Eat small, frequent meals instead of large meals.  Wear loose-fitting clothing. Do not wear anything tight around your waist that causes pressure on your stomach.  Raise the head of your bed 6 to 8 inches with wood blocks to help you sleep. Extra pillows will not help.  Only take over-the-counter or prescription medicines for pain, discomfort, or fever as directed by your caregiver.  Do not take aspirin, ibuprofen, or other nonsteroidal anti-inflammatory drugs (NSAIDs). SEEK IMMEDIATE MEDICAL CARE IF:   You have pain in your arms, neck, jaw, teeth, or back.  Your pain increases or changes in intensity or duration.  You develop nausea, vomiting, or sweating (diaphoresis).  You develop shortness of breath, or you faint.  Your vomit is green, yellow, black, or looks like coffee grounds or  blood.  Your stool is red, bloody, or black. These symptoms could be signs of other problems, such as heart disease, gastric bleeding, or esophageal bleeding. MAKE SURE YOU:   Understand these instructions.  Will watch your condition.  Will get help right away if you are not doing well or get worse. Document Released: 05/03/2005 Document Revised: 10/16/2011 Document Reviewed: 02/10/2011 [facility] Patient Information 2014 [place]Care, [place].

## 2013-12-17 ENCOUNTER — Encounter (HOSPITAL_COMMUNITY): Payer: Self-pay | Admitting: Emergency Medicine

## 2013-12-17 ENCOUNTER — Emergency Department (HOSPITAL_COMMUNITY)
Admission: EM | Admit: 2013-12-17 | Discharge: 2013-12-17 | Disposition: A | Discharge status: Home / Self Care | Payer: Managed Care, Other (non HMO) | Attending: Emergency Medicine | Admitting: Emergency Medicine

## 2013-12-17 DIAGNOSIS — K589 Irritable bowel syndrome without diarrhea: Secondary | ICD-10-CM | POA: Insufficient documentation

## 2013-12-17 DIAGNOSIS — J029 Acute pharyngitis, unspecified: Secondary | ICD-10-CM

## 2013-12-17 DIAGNOSIS — Z87891 Personal history of nicotine dependence: Secondary | ICD-10-CM | POA: Insufficient documentation

## 2013-12-17 DIAGNOSIS — Z79899 Other long term (current) drug therapy: Secondary | ICD-10-CM | POA: Insufficient documentation

## 2013-12-17 LAB — RAPID STREP SCREEN (MED CTR MEBANE ONLY): Streptococcus, Group A Screen (Direct): NEGATIVE

## 2013-12-17 MED ORDER — IBUPROFEN 800 MG PO TABS
800.0000 mg | ORAL_TABLET | Freq: Once | ORAL | Status: AC
Start: 2013-12-17 — End: 2013-12-17
  Administered 2013-12-17: 800 mg via ORAL
  Filled 2013-12-17: qty 1

## 2013-12-17 NOTE — Discharge Instructions (Signed)
Pharyngitis °Pharyngitis is redness, pain, and swelling (inflammation) of your pharynx.  °CAUSES  °Pharyngitis is usually caused by infection. Most of the time, these infections are from viruses (viral) and are part of a cold. However, sometimes pharyngitis is caused by bacteria (bacterial). Pharyngitis can also be caused by allergies. Viral pharyngitis may be spread from person to person by coughing, sneezing, and personal items or utensils (cups, forks, spoons, toothbrushes). Bacterial pharyngitis may be spread from person to person by more intimate contact, such as kissing.  °SIGNS AND SYMPTOMS  °Symptoms of pharyngitis include:   °· Sore throat.   °· Tiredness (fatigue).   °· Low-grade fever.   °· Headache. °· Joint pain and muscle aches. °· Skin rashes. °· Swollen lymph nodes. °· Plaque-like film on throat or tonsils (often seen with bacterial pharyngitis). °DIAGNOSIS  °Your health care provider will ask you questions about your illness and your symptoms. Your medical history, along with a physical exam, is often all that is needed to diagnose pharyngitis. Sometimes, a rapid strep test is done. Other lab tests may also be done, depending on the suspected cause.  °TREATMENT  °Viral pharyngitis will usually get better in 3 4 days without the use of medicine. Bacterial pharyngitis is treated with medicines that kill germs (antibiotics).  °HOME CARE INSTRUCTIONS  °· Drink enough water and fluids to keep your urine clear or pale yellow.   °· Only take over-the-counter or prescription medicines as directed by your health care provider:   °· If you are prescribed antibiotics, make sure you finish them even if you start to feel better.   °· Do not take aspirin.   °· Get lots of rest.   °· Gargle with 8 oz of salt water (½ tsp of salt per 1 qt of water) as often as every 1 2 hours to soothe your throat.   °· Throat lozenges (if you are not at risk for choking) or sprays may be used to soothe your throat. °SEEK MEDICAL  CARE IF:  °· You have large, tender lumps in your neck. °· You have a rash. °· You cough up green, yellow-brown, or bloody spit. °SEEK IMMEDIATE MEDICAL CARE IF:  °· Your neck becomes stiff. °· You drool or are unable to swallow liquids. °· You vomit or are unable to keep medicines or liquids down. °· You have severe pain that does not go away with the use of recommended medicines. °· You have trouble breathing (not caused by a stuffy nose). °MAKE SURE YOU:  °· Understand these instructions. °· Will watch your condition. °· Will get help right away if you are not doing well or get worse. °Document Released: 07/24/2005 Document Revised: 05/14/2013 Document Reviewed: 03/31/2013 °ExitCare® Patient Information ©2014 [employer], LLC. ° °

## 2013-12-17 NOTE — ED Notes (Signed)
md at bedside  Pt alert and oriented x4. Respirations even and unlabored, bilateral symmetrical rise and fall of chest. Skin warm and dry. In no acute distress. Denies needs.   

## 2013-12-17 NOTE — ED Notes (Addendum)
Pt states that he has had subjective fever and sore throat x 3 days.  Has not taken any antipyretics at home.

## 2013-12-17 NOTE — ED Provider Notes (Signed)
CSN: [id number]     Arrival date & time 12/17/13  0903 History   First MD Initiated Contact with Patient 12/17/13 0913     Chief Complaint  Patient presents with  . Fever  . Sore Throat     (Consider location/radiation/quality/duration/timing/severity/associated sxs/prior Treatment) HPI Comments: Patient presents with sore throat. He describes a three-day history of worsening sore throat. He says it hurts when he swallows. He's had a subjective fever at home. He feels achy all over. He denies any runny nose or nasal congestion. He denies any cough or chest congestion. He denies any vomiting or diarrhea. He's not taken anything at home for the fever this morning.  Patient is a 28 y.o. male presenting with fever and pharyngitis.  Fever Associated symptoms: myalgias and sore throat   Associated symptoms: no chest pain, no chills, no congestion, no cough, no diarrhea, no headaches, no nausea, no rash, no rhinorrhea and no vomiting   Sore Throat Pertinent negatives include no chest pain, no abdominal pain, no headaches and no shortness of breath.    Past Medical History  Diagnosis Date  . IBS (irritable bowel syndrome)     [place]   No past surgical history on file. No family history on file. History  Substance Use Topics  . Smoking status: Former Smoker    Types: Cigarettes  . Smokeless tobacco: Former NeurosurgeonUser    Quit date: 06/07/2013  . Alcohol Use: Yes     Comment: sometimes    Review of Systems  Constitutional: Positive for fever. Negative for chills, diaphoresis and fatigue.  HENT: Positive for sore throat. Negative for congestion, rhinorrhea and sneezing.   Eyes: Negative.   Respiratory: Negative for cough, chest tightness and shortness of breath.   Cardiovascular: Negative for chest pain and leg swelling.  Gastrointestinal: Negative for nausea, vomiting, abdominal pain, diarrhea and blood in stool.  Genitourinary: Negative for frequency, hematuria, flank pain  and difficulty urinating.  Musculoskeletal: Positive for myalgias. Negative for arthralgias and back pain.  Skin: Negative for rash.  Neurological: Negative for dizziness, speech difficulty, weakness, numbness and headaches.      Allergies  Review of patient's allergies indicates no known allergies.  Home Medications   Prior to Admission medications   Medication Sig Start Date End Date Taking? Authorizing Provider  esomeprazole (NEXIUM) 20 MG capsule Take 1 capsule (20 mg total) by mouth daily at 12 noon. 12/12/13   Olivia Mackielga M Otter, MD  hyoscyamine (LEVBID) 0.375 MG 12 hr tablet Take 1 tablet (0.375 mg total) by mouth every 12 (twelve) hours as needed for cramping. 12/12/13   Olivia Mackielga M Otter, MD  ondansetron (ZOFRAN ODT) 8 MG disintegrating tablet Take 1 tablet (8 mg total) by mouth every 8 (eight) hours as needed for nausea or vomiting. 12/12/13   Olivia Mackielga M Otter, MD  ranitidine (ZANTAC) 150 MG tablet Take 150 mg by mouth daily as needed for heartburn.    Historical Provider, MD  sucralfate (CARAFATE) 1 G tablet Take 1 tablet (1 g total) by mouth 4 (four) times daily. 12/12/13   Olivia Mackielga M Otter, MD   BP 115/66  Pulse 84  Temp(Src) 100 F (37.8 C) (Oral)  Resp 18  SpO2 100% Physical Exam  Constitutional: He is oriented to person, place, and time. He appears well-developed and well-nourished.  HENT:  Head: Normocephalic and atraumatic.  Right Ear: External ear normal.  Left Ear: External ear normal.  Mild erythema in the posterior pharynx. No exudates.  Uvula is midline. No trismus.  Eyes: Pupils are equal, round, and reactive to light.  Neck: Normal range of motion. Neck supple.  Cardiovascular: Normal rate, regular rhythm and normal heart sounds.   Pulmonary/Chest: Effort normal and breath sounds normal. No respiratory distress. He has no wheezes. He has no rales. He exhibits no tenderness.  Abdominal: Soft. Bowel sounds are normal. There is no tenderness. There is no rebound and no guarding.   Musculoskeletal: Normal range of motion. He exhibits no edema.  Lymphadenopathy:    He has no cervical adenopathy.  Neurological: He is alert and oriented to person, place, and time.  Skin: Skin is warm and dry. No rash noted.  Psychiatric: He has a normal mood and affect.    ED Course  Procedures (including critical care time) Labs Review Labs Reviewed  RAPID STREP SCREEN  CULTURE, GROUP A STREP    Imaging Review No results found.   EKG Interpretation None      MDM   Final diagnoses:  Pharyngitis    Strep test is negative. Patient is well-appearing in has no difficulty swallowing or handling secretions. This is likely viral however throat culture is pending. He was advised in symptomatic care and advised to return if his symptoms worsen.    Rolan BuccoMelanie Shahir Karen, MD 12/17/13 [phone]

## 2013-12-17 NOTE — ED Notes (Signed)
md at bedside

## 2013-12-17 NOTE — ED Notes (Signed)
Pt escorted to discharge window. Pt verbalized understanding discharge instructions. In no acute distress.  

## 2013-12-19 LAB — CULTURE, GROUP A STREP

## 2014-09-25 IMAGING — CR DG ABDOMEN 2V
2 series · 2 of 2 positions shown · non-contrast
Comparison: None.

CLINICAL DATA: Abdominal pain.  History of IBS.

EXAM:
ABDOMEN - 2 VIEW

[AP (1 of 2)]
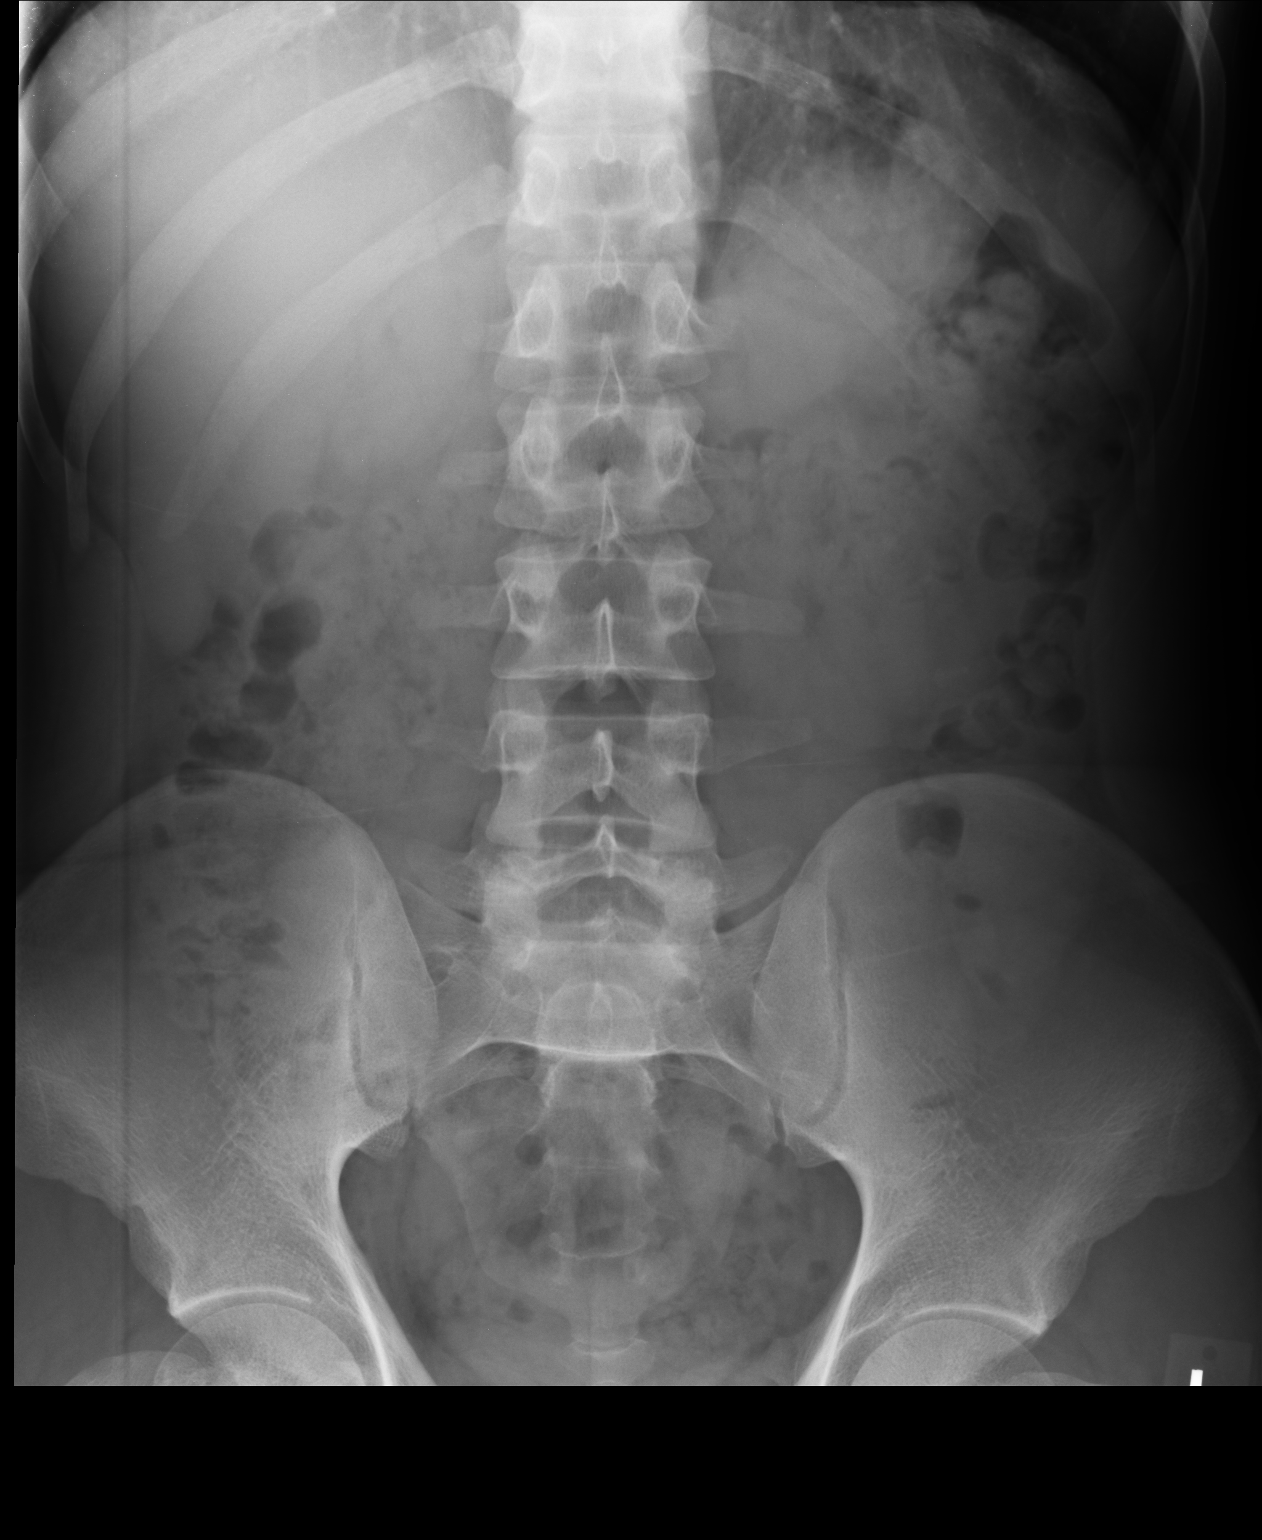

[AP (2 of 2)]
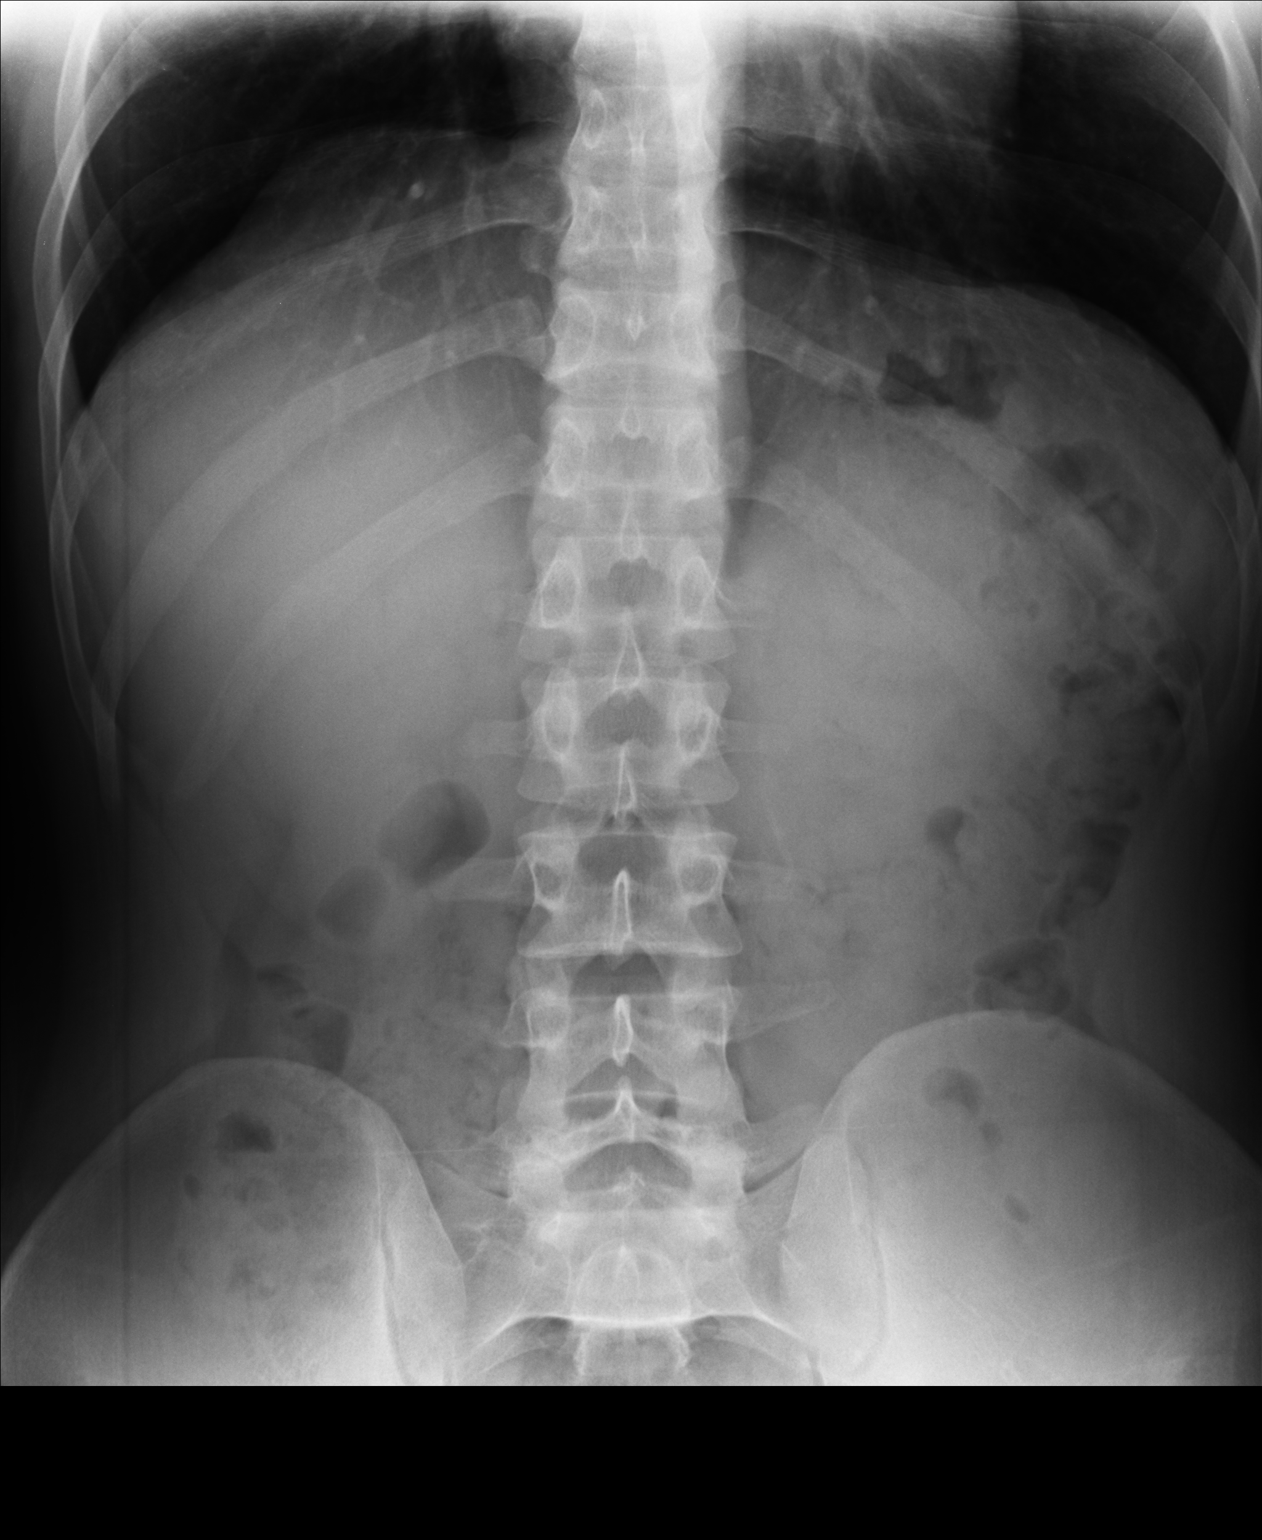

[2 of 2 positions shown; findings below may reference images not displayed]

FINDINGS: Bowel gas pattern is nonobstructive with mild fecal retention
throughout the colon. No evidence of free air or air-fluid levels.
No dilated small bowel loops. Bones and soft tissues are
unremarkable.
IMPRESSION: Nonobstructive bowel gas pattern with mild fecal retention
throughout the colon.
